# Patient Record
Sex: Female | Born: 1960 | Race: Black or African American | Hispanic: No | Marital: Single | Smoking: Never smoker
Health system: Southern US, Community
[De-identification: ages and names within clinical notes are randomized; demographics above are authoritative.]

## PROBLEM LIST (undated history)

## (undated) DIAGNOSIS — I5020 Unspecified systolic (congestive) heart failure: Secondary | ICD-10-CM

## (undated) DIAGNOSIS — I10 Essential (primary) hypertension: Secondary | ICD-10-CM

## (undated) DIAGNOSIS — I700 Atherosclerosis of aorta: Secondary | ICD-10-CM

## (undated) DIAGNOSIS — I480 Paroxysmal atrial fibrillation: Secondary | ICD-10-CM

## (undated) DIAGNOSIS — I77810 Thoracic aortic ectasia: Secondary | ICD-10-CM

## (undated) DIAGNOSIS — I519 Heart disease, unspecified: Secondary | ICD-10-CM

## (undated) HISTORY — DX: Essential (primary) hypertension: I10

## (undated) HISTORY — PX: ANKLE SURGERY: SHX546

## (undated) HISTORY — DX: Thoracic aortic ectasia: I77.810

## (undated) HISTORY — DX: Atherosclerosis of aorta: I70.0

---

## 2014-05-07 ENCOUNTER — Observation Stay: Payer: Self-pay | Admitting: Internal Medicine

## 2014-05-07 DIAGNOSIS — I517 Cardiomegaly: Secondary | ICD-10-CM

## 2014-05-07 LAB — CBC WITH DIFFERENTIAL/PLATELET
BASOS ABS: 0.1 10*3/uL (ref 0.0–0.1)
BASOS PCT: 0.7 %
EOS PCT: 0.4 %
Eosinophil #: 0 10*3/uL (ref 0.0–0.7)
HCT: 42.6 % (ref 35.0–47.0)
HGB: 13.8 g/dL (ref 12.0–16.0)
Lymphocyte #: 2.4 10*3/uL (ref 1.0–3.6)
Lymphocyte %: 30.9 %
MCH: 29.5 pg (ref 26.0–34.0)
MCHC: 32.5 g/dL (ref 32.0–36.0)
MCV: 91 fL (ref 80–100)
MONO ABS: 0.6 x10 3/mm (ref 0.2–0.9)
Monocyte %: 7.4 %
NEUTROS PCT: 60.6 %
Neutrophil #: 4.6 10*3/uL (ref 1.4–6.5)
PLATELETS: 269 10*3/uL (ref 150–440)
RBC: 4.69 10*6/uL (ref 3.80–5.20)
RDW: 13.8 % (ref 11.5–14.5)
WBC: 7.6 10*3/uL (ref 3.6–11.0)

## 2014-05-07 LAB — URINALYSIS, COMPLETE
BACTERIA: NONE SEEN
BLOOD: NEGATIVE
Bilirubin,UR: NEGATIVE
Glucose,UR: NEGATIVE mg/dL (ref 0–75)
Hyaline Cast: 20
KETONE: NEGATIVE
LEUKOCYTE ESTERASE: NEGATIVE
NITRITE: NEGATIVE
Ph: 5 (ref 4.5–8.0)
Protein: NEGATIVE
RBC,UR: 4 /HPF (ref 0–5)
SPECIFIC GRAVITY: 1.023 (ref 1.003–1.030)
Squamous Epithelial: 4

## 2014-05-07 LAB — BASIC METABOLIC PANEL
Anion Gap: 8 (ref 7–16)
BUN: 18 mg/dL (ref 7–18)
CO2: 33 mmol/L — AB (ref 21–32)
Calcium, Total: 8.8 mg/dL (ref 8.5–10.1)
Chloride: 99 mmol/L (ref 98–107)
Creatinine: 0.92 mg/dL (ref 0.60–1.30)
GLUCOSE: 95 mg/dL (ref 65–99)
Osmolality: 281 (ref 275–301)
POTASSIUM: 2.7 mmol/L — AB (ref 3.5–5.1)
Sodium: 140 mmol/L (ref 136–145)

## 2014-05-07 LAB — MAGNESIUM: Magnesium: 2 mg/dL

## 2014-05-07 LAB — CK TOTAL AND CKMB (NOT AT ARMC)
CK, TOTAL: 94 U/L
CK, Total: 109 U/L
CK-MB: 1.1 ng/mL (ref 0.5–3.6)
CK-MB: 0.9 ng/mL (ref 0.5–3.6)

## 2014-05-07 LAB — TROPONIN I: Troponin-I: 0.02 ng/mL

## 2014-05-07 LAB — POTASSIUM: POTASSIUM: 3.4 mmol/L — AB (ref 3.5–5.1)

## 2014-05-10 ENCOUNTER — Encounter: Payer: Self-pay | Admitting: *Deleted

## 2014-05-11 ENCOUNTER — Encounter: Payer: Self-pay | Admitting: Cardiovascular Disease

## 2014-11-03 NOTE — Discharge Summary (Signed)
PATIENT NAME:  [name], [name] MR#:  [id number] DATE OF BIRTH:  [birth year]  DATE OF ADMISSION:  05/07/2014 DATE OF DISCHARGE:  05/07/2014  PRESENTING COMPLAINT: An episode of dizziness and near syncope.   DISCHARGE DIAGNOSES:  1. Near syncope resolved, suspect could be vasovagal.  2. Hypokalemia repleted.  3. Hypertension.  4 EKG with T wave inversion asymptomatic. Follow up as outpatient with cardiology.   CONDITION ON DISCHARGE: Fair.   CODE STATUS: Full code.   MEDICATIONS:  1. Losartan 50 mg p.o. daily.  2. Aspirin 81 mg daily. The patient recommend to start taking after dental procedure.  3. Ibuprofen 600 mg 1 tablet 3 times a day as needed.  4. Follow up with Dr. Kirke Corin for abnormal EKG. 5. Follow up with your PCP in [place].   Cardiac enzymes x 3 negative. Serum potassium was 3.4. Ultrasound carotid Doppler negative for stenosis. An LV ejection fraction 65 to 70% impaired relaxation of the diastolic feeling moderate continent left ventricular hypertrophy. CBC within normal limits. Basic metabolic panel within normal limits. Urinalysis negative for urinary tract infection.   BRIEF SUMMARY OF HOSPITAL COURSE:  1. Jo Knapp is a 54 year old very pleasant African American female with history of hypertension who came into the Emergency Room after she fell having near syncopal episode x 2 along with dizziness at work. She was admitted with near syncopal episode, which likely was vasovagal. No acute cerebrovascular accident on CT head. Remained in sinus rhythm 66 on telemetry. Cardiac enzymes are negative. No chest pain or shortness of breath. No neuro deficits noted. Ultrasound carotid Doppler was negative.  2. Hypokalemia repleted.  3. Hypertension. Resumed. Losartan. 4. EKG with T wave inversion in lateral leads status: I spoke with Dr. Kirke Corin who will see the patient as outpatient. Echo showed results as above. Hospital stay otherwise remained stable.   CODE STATUS: The  patient remained a full code x 10.   TIME SPENT: 40 minutes.    ____________________________ Wylie Hail Allena Katz, MD sap:JT D: 05/08/2014 19:37:03 ET T: 05/09/2014 06:05:12 ET JOB#: [id number]  cc: Jo Knapp A. Allena Katz, MD, <Dictator> Jo Ora MD ELECTRONICALLY SIGNED 05/24/2014 7:41

## 2014-11-03 NOTE — H&P (Signed)
PATIENT NAME:  Jo Knapp, GREENLAW MR#:  [id number] DATE OF BIRTH:  [birth year]  DATE OF ADMISSION:  05/07/2014  REFERRING PHYSICIAN:  Rebecka Apley, MD   PRIMARY CARE PHYSICIAN:  Primary care physician in [place]   ADMITTING PHYSICIAN:  Crissie Figures, MD  CHIEF COMPLAINT:  Episodes of dizziness with near syncope x 2 episodes.   HISTORY OF PRESENT ILLNESS:  Jo Knapp is a 54 year old African-American female with a past medical history significant for hypertension, who was in his usual state of health and while she was at work tonight, she suddenly felt episodes of dizziness associated with some lightheadedness. She stated that she felt hot and sweaty and following which she felt dizziness, but did not lose consciousness. This happened twice at 45-minute interval in-between. No associated chest pain. No shortness of breath. No focal weakness or numbness. No nausea. No vomiting now. No diarrhea. No abdominal pain. No history of any recent cough, cold, fever. She did have a toothache recently for which she was given some pain medications. The patient was brought to the Emergency Room and since the initial episodes of dizziness, she does not have any further episodes of dizziness or loss of consciousness. No chest pains or shortness of breath.   In the Emergency Room, the patient was evaluated by the ED physician and was found to have elevated blood pressure but otherwise normal examination and workup was essentially negative except for EKG with sinus bradycardia and T wave inversions in lateral leads. She was also noted to have low potassium of 2.7. The hospitalist service was consulted for further management. The patient is comfortably resting in bed at this time and denies any complaints such as dizziness, chest pain, or diaphoresis.   PAST MEDICAL HISTORY:  Hypertension.   PAST SURGICAL HISTORY:  1.  Bilateral tubal ligation.  2.  Ankle surgery.  HOME MEDICATIONS:  Metoprolol,  hydrochlorothiazide, losartan, dosage not known.   ALLERGIES:  No known drug allergies.   SOCIAL HISTORY:  She is single, works at [employer]. No history of smoking. She does take occasional alcohol and denies any drug usage.   FAMILY HISTORY:  Significant for mother with heart disease.    REVIEW OF SYSTEMS:  CONSTITUTIONAL:  Negative for fever, fatigue, general weakness, syncope, abnormal weight gain or weight loss recently.  EYES:  Negative for blurred vision or double vision. No pain. No redness. No inflammation.  EARS, NOSE, AND THROAT:  Negative for tinnitus, ear pain, hearing loss. No epistaxis. No nasal discharge. No difficulty swallowing.  RESPIRATORY:  Negative for cough, wheezing, hemoptysis, dyspnea, painful respirations.  CARDIOVASCULAR:  Negative for chest pain, dyspnea on exertion, orthopnea, pedal edema, palpitations. She did have some near syncopal episode with lightheadedness as mentioned in the history of present illness but denies any history of loss of consciousness.  GASTROINTESTINAL:  Negative for nausea, vomiting, diarrhea, abdominal pain, hematemesis, melena, GERD symptoms, or rectal bleeding.  GENITOURINARY:  Negative for dysuria, hematuria, frequency, or urgency.   ENDOCRINE:  Negative for polyuria or polydipsia. No heat or cold intolerance.  HEMATOLOGIC AND LYMPHATIC:  Negative for anemia, easy bruising, bleeding, or swollen glands.  INTEGUMENTARY:  Negative for acne, skin rash, or lesions.  MUSCULOSKELETAL:  Negative for neck pain, back pain, arthritis, or joint swellings.  NEUROLOGICAL:  Negative for focal weakness or numbness. No history of CVA, TIA, or seizure episodes. She did have some near syncopal episodes as noted in the history of present illness.  PSYCHIATRIC:  Negative  for anxiety, insomnia, or depression.    PHYSICAL EXAMINATION: VITAL SIGNS:  On arrival, temperature 97.9 degrees Fahrenheit, pulse rate 57 per minute, respirations 18 per  minute, blood pressure 168/100, oxygen saturation 95% on room air. Current vital signs:  Pulse rate 66 per minute, respirations 18 per minute, blood pressure 137/86, oxygen saturation 95% on room air.  GENERAL:  Well-developed, well-nourished, young female, pleasant and cooperative, comfortably lying in the bed, not in any distress. Alert and oriented.  HEAD:  Atraumatic, normocephalic.   EYES:  Pupils are equal and reactive to light and accommodation. No conjunctival pallor. No scleral icterus. Extraocular movements are intact.  NOSE:  No nasal lesions. No drainage.  EARS:  No drainage. No external lesions.  Oral cavity:  No mucosal lesions. No exudates. No masses.  NECK:  Supple. No JVD. No thyromegaly. No carotid bruit. Range of motion is normal.  RESPIRATORY:  Good respiratory effort. Not using any accessory muscles of respiration. Bilateral vesicular breath sounds present. No rales or rhonchi noted.  CARDIOVASCULAR:  S1, S2 regular. No murmurs appreciated. No gallops. No clicks. Pulses are equal at carotid, femur, and pedal pulses. No peripheral edema.  GASTROINTESTINAL:  Abdomen is soft and nontender. No organomegaly. The bowel sounds are present and equal in all four quadrants. No rebound. No guarding. No rigidity.  GENITOURINARY:  Deferred.  MUSCULOSKELETAL:  No joint tenderness or effusions. Range of motion is adequate. Strength and tone are equal bilaterally.  SKIN:  Inspection within normal limits.  LYMPH NODES:  No cervical lymphadenopathy.  VASCULAR:  Good dorsalis pedis and posterior tibial pulses.  NEUROLOGICAL:  Alert, awake, and oriented x 3. Cranial nerves II through XII are grossly intact. DTRs are 2+ bilaterally and symmetrical in upper and lower extremities. Motor strength is 5/5 in both upper and lower extremities bilaterally.  PSYCHIATRIC:  Judgment and insight are adequate. Alert and oriented x 3. Motor and memory are within normal limits.    LABORATORY DATA:  Serum glucose  is 95, BUN 18, creatinine 0.92, serum sodium 140, potassium 2.7, chloride 99, bicarbonate 33, and calcium 8.8. Troponin is less than 0.02. WBC is 7.6, hemoglobin 13.8, hematocrit 42.6, platelet count 269, MCV 91. Urinalysis:  No bacteria.   IMAGING STUDIES:  CT of the head, noncontrast study:  White matter changes in the  periventricular region with hypodensities,  white matter hypoattenuation, age indeterminate lacunar infarction. Recommend clinical correlation for acute ischemia.   EKG:  Sinus bradycardia with ventricular rate of 54 beats per minute and T-wave inversions in anterior lateral leads.   ASSESSMENT AND PLAN:  A 54 year old African-American female with a past medical history significant for hypertension, who presents with episodes of near syncope x 2 episodes 45 minutes apart each.   1.  Near syncopal episodes x 2, likely vasovagal, rule out arrhythmias, rule out acute coronary event, rule out acute neurological event. Plan:  Admit to telemetry. Aspirin, orthostatic vital signs, cycle cardiac enzymes, carotid Doppler, echocardiogram, and monitor.   2.  Hypokalemia. Will start potassium supplementation IV and oral supplementation, follow BMP. Check magnesium level and follow up accordingly.  3.  Hypertension, history of hypertension, blood pressure high on presentation, current blood pressure in acceptable range. Continue home medications.  4.  Deep vein thrombosis prophylaxis with subcutaneous Lovenox. 5.  Gastrointestinal prophylaxis with Protonix.   CODE STATUS:  Full code.   TIME SPENT:  55 minutes.     ____________________________ Crissie Figures, MD enr:nb D: 05/07/2014 88:11:03 ET T:  05/07/2014 06:42:47 ET JOB#: [id number]  cc: Crissie Figures, MD, <Dictator> Crissie Figures MD ELECTRONICALLY SIGNED 05/10/2014 19:46

## 2018-08-30 ENCOUNTER — Encounter: Payer: Self-pay | Admitting: *Deleted

## 2018-08-30 ENCOUNTER — Other Ambulatory Visit: Payer: Self-pay

## 2018-08-30 ENCOUNTER — Emergency Department
Admission: EM | Admit: 2018-08-30 | Discharge: 2018-08-30 | Disposition: A | Discharge status: Home / Self Care | Payer: Self-pay | Attending: Emergency Medicine | Admitting: Emergency Medicine

## 2018-08-30 DIAGNOSIS — N3000 Acute cystitis without hematuria: Secondary | ICD-10-CM | POA: Insufficient documentation

## 2018-08-30 DIAGNOSIS — I10 Essential (primary) hypertension: Secondary | ICD-10-CM | POA: Insufficient documentation

## 2018-08-30 DIAGNOSIS — Z7982 Long term (current) use of aspirin: Secondary | ICD-10-CM | POA: Insufficient documentation

## 2018-08-30 LAB — URINALYSIS, COMPLETE (UACMP) WITH MICROSCOPIC
Bacteria, UA: NONE SEEN
Bilirubin Urine: NEGATIVE
Glucose, UA: NEGATIVE mg/dL
Hgb urine dipstick: NEGATIVE
Ketones, ur: 5 mg/dL — AB
Nitrite: NEGATIVE
Protein, ur: 30 mg/dL — AB
SPECIFIC GRAVITY, URINE: 1.036 — AB (ref 1.005–1.030)
WBC, UA: >50 WBC/hpf — ABNORMAL HIGH (ref 0–5)
pH: 5 (ref 5.0–8.0)

## 2018-08-30 LAB — CBC
HCT: 43.1 % (ref 36.0–46.0)
Hemoglobin: 14.1 g/dL (ref 12.0–15.0)
MCH: 29.6 pg (ref 26.0–34.0)
MCHC: 32.7 g/dL (ref 30.0–36.0)
MCV: 90.4 fL (ref 80.0–100.0)
PLATELETS: 280 10*3/uL (ref 150–400)
RBC: 4.77 MIL/uL (ref 3.87–5.11)
RDW: 13.4 % (ref 11.5–15.5)
WBC: 9.2 10*3/uL (ref 4.0–10.5)
nRBC: 0 % (ref 0.0–0.2)

## 2018-08-30 LAB — COMPREHENSIVE METABOLIC PANEL
ALT: 32 U/L (ref 0–44)
AST: 34 U/L (ref 15–41)
Albumin: 4.3 g/dL (ref 3.5–5.0)
Alkaline Phosphatase: 76 U/L (ref 38–126)
Anion gap: 5 (ref 5–15)
BUN: 21 mg/dL — ABNORMAL HIGH (ref 6–20)
CO2: 30 mmol/L (ref 22–32)
Calcium: 8.7 mg/dL — ABNORMAL LOW (ref 8.9–10.3)
Chloride: 106 mmol/L (ref 98–111)
Creatinine, Ser: 0.76 mg/dL (ref 0.44–1.00)
GFR calc non Af Amer: >60 mL/min (ref >60)
Glucose, Bld: 105 mg/dL — ABNORMAL HIGH (ref 70–99)
Potassium: 3.1 mmol/L — ABNORMAL LOW (ref 3.5–5.1)
SODIUM: 141 mmol/L (ref 135–145)
Total Bilirubin: 0.7 mg/dL (ref 0.3–1.2)
Total Protein: 7.6 g/dL (ref 6.5–8.1)

## 2018-08-30 LAB — LIPASE, BLOOD: Lipase: 38 U/L (ref 11–51)

## 2018-08-30 MED ORDER — ONDANSETRON 4 MG PO TBDP
8.0000 mg | ORAL_TABLET | Freq: Once | ORAL | Status: AC
  Administered 2018-08-30: 8 mg via ORAL
  Filled 2018-08-30: qty 2

## 2018-08-30 MED ORDER — SODIUM CHLORIDE 0.9% FLUSH: 3.0000 mL | Freq: Once | INTRAVENOUS | Status: DC

## 2018-08-30 MED ORDER — ONDANSETRON 8 MG PO TBDP: 8.0000 mg | ORAL_TABLET | Freq: Three times a day (TID) | ORAL | 0 refills | Status: DC | PRN

## 2018-08-30 MED ORDER — CEPHALEXIN 500 MG PO CAPS: 500.0000 mg | ORAL_CAPSULE | Freq: Two times a day (BID) | ORAL | 0 refills | Status: AC

## 2018-08-30 MED ORDER — CEPHALEXIN 500 MG PO CAPS
500.0000 mg | ORAL_CAPSULE | Freq: Once | ORAL | Status: AC
  Administered 2018-08-30: 500 mg via ORAL
  Filled 2018-08-30: qty 1

## 2018-08-30 NOTE — ED Notes (Signed)
EDP in with patient 

## 2018-08-30 NOTE — ED Notes (Signed)
Patient states stomach has been aching like menstrual cramps but has not had a period in a long time for a few days and having body aches starting tonight. Patient states became nauseated at work and came to ED. Patient states having some incontinence of urine the past 2 weeks.

## 2018-08-30 NOTE — ED Provider Notes (Signed)
[name] Emergency Department Provider Note ____________________________________________   First MD Initiated Contact with Patient 08/30/18 0404     (approximate)  I have reviewed the triage vital signs and the nursing notes.   HISTORY  Chief Complaint Nausea    HPI Jo Knapp is a 58 y.o. female with PMH of hypertension who presents with nausea and lower abdominal crampy pain over the last several days.  She also reports some urinary frequency (but not incontinence as noted by RN) over the last 1 to 2 weeks.  The patient denies associated vomiting, fever, or weakness.  She states that she has had UTIs when she was younger but none in the last several years.   Past Medical History:  Diagnosis Date  . Hypertension     There are no active problems to display for this patient.     Prior to Admission medications   Medication Sig Start Date End Date Taking? Authorizing Provider  aspirin 81 MG tablet Take 81 mg by mouth daily.    [provider]  cephALEXin (KEFLEX) 500 MG capsule Take 1 capsule (500 mg total) by mouth 2 (two) times daily for 7 days. 08/30/18 09/06/18  Dionne Bucy, MD  ibuprofen (ADVIL,MOTRIN) 600 MG tablet Take 600 mg by mouth 3 (three) times daily as needed.    [provider]  losartan (COZAAR) 50 MG tablet Take 50 mg by mouth daily.    [provider]  ondansetron (ZOFRAN ODT) 8 MG disintegrating tablet Take 1 tablet (8 mg total) by mouth every 8 (eight) hours as needed for nausea or vomiting. 08/30/18   Dionne Bucy, MD    Allergies Patient has no known allergies.  Family History  Problem Relation Age of Onset  . Heart disease Mother     Social History Social History   Tobacco Use  . Smoking status: Never Smoker  . Smokeless tobacco: Never Used  Substance Use Topics  . Alcohol use: Yes  . Drug use: Not Currently    Review of Systems  Constitutional: No fever. Eyes: No  redness. ENT: No sore throat. Cardiovascular: Denies chest pain. Respiratory: Denies shortness of breath. Gastrointestinal: Positive for nausea, no vomiting or diarrhea. Genitourinary: Positive for frequency. Musculoskeletal: Negative for back pain. Skin: Negative for rash. Neurological: Negative for headache.   ____________________________________________   PHYSICAL EXAM:  VITAL SIGNS: ED Triage Vitals [08/30/18 0033]  Enc Vitals Group     BP (!) 173/108     Pulse Rate 86     Resp 20     Temp 98 F (36.7 C)     Temp Source Oral     SpO2 97 %     Weight 210 lb (95.3 kg)     Height 5\' 6"  (1.676 m)     Head Circumference      Peak Flow      Pain Score 8     Pain Loc      Pain Edu?      Excl. in GC?     Constitutional: Alert and oriented. Well appearing and in no acute distress. Eyes: Conjunctivae are normal.  Head: Atraumatic. Nose: No congestion/rhinnorhea. Mouth/Throat: Mucous membranes are moist.   Neck: Normal range of motion.  Cardiovascular: Good peripheral circulation. Respiratory: Normal respiratory effort.  No retractions. Gastrointestinal: Soft and nontender. No distention.  Genitourinary: No flank tenderness. Musculoskeletal:  Extremities warm and well perfused.  Neurologic:  Normal speech and language. No gross focal neurologic deficits are appreciated.  Skin:  Skin is warm and dry. No rash noted. Psychiatric: Mood and affect are normal. Speech and behavior are normal.  ____________________________________________   LABS (all labs ordered are listed, but only abnormal results are displayed)  Labs Reviewed  COMPREHENSIVE METABOLIC PANEL - Abnormal; Notable for the following components:      Result Value   Potassium 3.1 (*)    Glucose, Bld 105 (*)    BUN 21 (*)    Calcium 8.7 (*)    All other components within normal limits  URINALYSIS, COMPLETE (UACMP) WITH MICROSCOPIC - Abnormal; Notable for the following components:   Color, Urine AMBER (*)     APPearance HAZY (*)    Specific Gravity, Urine 1.036 (*)    Ketones, ur 5 (*)    Protein, ur 30 (*)    Leukocytes,Ua MODERATE (*)    WBC, UA >50 (*)    All other components within normal limits  LIPASE, BLOOD  CBC   ____________________________________________  EKG   ____________________________________________  RADIOLOGY    ____________________________________________   PROCEDURES  Procedure(s) performed: No  Procedures  Critical Care performed: No ____________________________________________   INITIAL IMPRESSION / ASSESSMENT AND PLAN / ED COURSE  Pertinent labs & imaging results that were available during my care of the patient were reviewed by me and considered in my medical decision making (see chart for details).  58 year old female with PMH of hypertension presents with nausea acute onset today but not associated with vomiting or diarrhea.  In addition she has had crampy lower abdominal pain over the last few days, and reports urinary frequency over the last 1 to 2 weeks.  I reviewed the past medical records in Epic.  The patient was most recently admitted in 2015 for near syncope and hypokalemia.  On exam the patient is overall very well-appearing.  She has hypertension (and states that she has not taken her blood pressure medication today) but otherwise normal vital signs.  Her abdomen is soft and nontender.  The remainder of the exam is unremarkable.  Lab work-up obtained from triage is reassuring.  However the patient does have findings consistent with UTI on her urinalysis.  UTI/cystitis is overall the most likely explanation of her symptoms.  Differential also includes gastroenteritis.  The patient is postmenopausal, and I do not suspect any acute gynecologic cause.  Although the blood pressure is elevated, this is likely related to poor medication compliance.  The patient has no symptoms of endorgan dysfunction and no evidence of hypertensive  urgency.  At this time the patient is stable for discharge home.  There is no indication for imaging.  We will treat for UTI with Keflex, and give Zofran for nausea.  Return precautions given, the patient expresses understanding. ____________________________________________   FINAL CLINICAL IMPRESSION(S) / ED DIAGNOSES  Final diagnoses:  Acute cystitis without hematuria      NEW MEDICATIONS STARTED DURING THIS VISIT:  Discharge Medication List as of 08/30/2018  4:56 AM    START taking these medications   Details  cephALEXin (KEFLEX) 500 MG capsule Take 1 capsule (500 mg total) by mouth 2 (two) times daily for 7 days., Starting Tue 08/30/2018, Until Tue 09/06/2018, Normal    ondansetron (ZOFRAN ODT) 8 MG disintegrating tablet Take 1 tablet (8 mg total) by mouth every 8 (eight) hours as needed for nausea or vomiting., Starting Tue 08/30/2018, Normal         Note:  This document was prepared using Dragon voice recognition software and may  include unintentional dictation errors.    Dionne Bucy, MD 08/30/18 [phone]

## 2018-08-30 NOTE — ED Triage Notes (Signed)
Pt c/o nausea.  Sx began tonight while at work.  Pt also reports abd pain.  Pt alert  Speech clear.

## 2018-08-30 NOTE — Discharge Instructions (Signed)
Take the antibiotic as prescribed and finish the full course.  You may take the Zofran as needed for nausea.  Return to the ER for new, worsening, or persistent abdominal pain, nausea or vomiting, fever, weakness, or any other new or worsening symptoms that concern you.

## 2019-12-13 ENCOUNTER — Emergency Department: Payer: No Typology Code available for payment source

## 2019-12-13 ENCOUNTER — Emergency Department
Admission: EM | Admit: 2019-12-13 | Discharge: 2019-12-13 | Disposition: A | Discharge status: Home / Self Care | Payer: No Typology Code available for payment source | Attending: Emergency Medicine | Admitting: Emergency Medicine

## 2019-12-13 ENCOUNTER — Other Ambulatory Visit: Payer: Self-pay

## 2019-12-13 ENCOUNTER — Encounter: Payer: Self-pay | Admitting: Emergency Medicine

## 2019-12-13 DIAGNOSIS — S7002XA Contusion of left hip, initial encounter: Secondary | ICD-10-CM | POA: Insufficient documentation

## 2019-12-13 DIAGNOSIS — Y9389 Activity, other specified: Secondary | ICD-10-CM | POA: Insufficient documentation

## 2019-12-13 DIAGNOSIS — M25512 Pain in left shoulder: Secondary | ICD-10-CM | POA: Insufficient documentation

## 2019-12-13 DIAGNOSIS — Y92410 Unspecified street and highway as the place of occurrence of the external cause: Secondary | ICD-10-CM | POA: Diagnosis not present

## 2019-12-13 DIAGNOSIS — I10 Essential (primary) hypertension: Secondary | ICD-10-CM | POA: Insufficient documentation

## 2019-12-13 DIAGNOSIS — S161XXA Strain of muscle, fascia and tendon at neck level, initial encounter: Secondary | ICD-10-CM | POA: Insufficient documentation

## 2019-12-13 DIAGNOSIS — Y999 Unspecified external cause status: Secondary | ICD-10-CM | POA: Diagnosis not present

## 2019-12-13 DIAGNOSIS — R030 Elevated blood-pressure reading, without diagnosis of hypertension: Secondary | ICD-10-CM

## 2019-12-13 DIAGNOSIS — S199XXA Unspecified injury of neck, initial encounter: Secondary | ICD-10-CM | POA: Diagnosis present

## 2019-12-13 MED ORDER — NAPROXEN 500 MG PO TABS: 500.0000 mg | ORAL_TABLET | Freq: Two times a day (BID) | ORAL | 0 refills | Status: DC
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
## 2020-04-08 ENCOUNTER — Encounter (HOSPITAL_COMMUNITY): Payer: Self-pay | Admitting: Emergency Medicine

## 2020-04-08 ENCOUNTER — Emergency Department (HOSPITAL_COMMUNITY): Payer: HRSA Program

## 2020-04-08 ENCOUNTER — Other Ambulatory Visit: Payer: Self-pay

## 2020-04-08 DIAGNOSIS — J1282 Pneumonia due to coronavirus disease 2019: Secondary | ICD-10-CM | POA: Diagnosis present

## 2020-04-08 DIAGNOSIS — J9601 Acute respiratory failure with hypoxia: Secondary | ICD-10-CM | POA: Diagnosis present

## 2020-04-08 DIAGNOSIS — I10 Essential (primary) hypertension: Secondary | ICD-10-CM | POA: Diagnosis present

## 2020-04-08 DIAGNOSIS — U071 COVID-19: Secondary | ICD-10-CM | POA: Diagnosis not present

## 2020-04-08 DIAGNOSIS — Z7982 Long term (current) use of aspirin: Secondary | ICD-10-CM

## 2020-04-08 DIAGNOSIS — Z79899 Other long term (current) drug therapy: Secondary | ICD-10-CM

## 2020-04-08 DIAGNOSIS — Z8249 Family history of ischemic heart disease and other diseases of the circulatory system: Secondary | ICD-10-CM

## 2020-04-08 DIAGNOSIS — Z791 Long term (current) use of non-steroidal anti-inflammatories (NSAID): Secondary | ICD-10-CM

## 2020-04-08 DIAGNOSIS — E876 Hypokalemia: Secondary | ICD-10-CM | POA: Diagnosis present

## 2020-04-08 DIAGNOSIS — J159 Unspecified bacterial pneumonia: Secondary | ICD-10-CM | POA: Diagnosis present

## 2020-04-08 DIAGNOSIS — R0682 Tachypnea, not elsewhere classified: Secondary | ICD-10-CM | POA: Diagnosis not present

## 2020-04-08 DIAGNOSIS — E669 Obesity, unspecified: Secondary | ICD-10-CM | POA: Diagnosis present

## 2020-04-08 DIAGNOSIS — Z6833 Body mass index (BMI) 33.0-33.9, adult: Secondary | ICD-10-CM

## 2020-04-08 DIAGNOSIS — I2699 Other pulmonary embolism without acute cor pulmonale: Secondary | ICD-10-CM | POA: Diagnosis present

## 2020-04-08 NOTE — ED Triage Notes (Signed)
Pt from home via [facility]. Pt reports SHOB that started at 1200 today. Pt reports positive COVID test on 9/16.

## 2020-04-09 ENCOUNTER — Emergency Department (HOSPITAL_COMMUNITY): Payer: HRSA Program

## 2020-04-09 ENCOUNTER — Other Ambulatory Visit: Payer: Self-pay

## 2020-04-09 ENCOUNTER — Inpatient Hospital Stay (HOSPITAL_COMMUNITY)
Admission: EM | Admit: 2020-04-09 | Discharge: 2020-04-12 | DRG: 177 | Disposition: A | Discharge status: Home / Self Care | Payer: HRSA Program | Attending: Family Medicine | Admitting: Family Medicine

## 2020-04-09 DIAGNOSIS — E669 Obesity, unspecified: Secondary | ICD-10-CM | POA: Diagnosis present

## 2020-04-09 DIAGNOSIS — I10 Essential (primary) hypertension: Secondary | ICD-10-CM | POA: Diagnosis present

## 2020-04-09 DIAGNOSIS — J069 Acute upper respiratory infection, unspecified: Secondary | ICD-10-CM | POA: Diagnosis present

## 2020-04-09 DIAGNOSIS — J1282 Pneumonia due to coronavirus disease 2019: Secondary | ICD-10-CM | POA: Diagnosis present

## 2020-04-09 DIAGNOSIS — Z7982 Long term (current) use of aspirin: Secondary | ICD-10-CM | POA: Diagnosis not present

## 2020-04-09 DIAGNOSIS — J9601 Acute respiratory failure with hypoxia: Secondary | ICD-10-CM | POA: Diagnosis present

## 2020-04-09 DIAGNOSIS — I2699 Other pulmonary embolism without acute cor pulmonale: Secondary | ICD-10-CM | POA: Diagnosis present

## 2020-04-09 DIAGNOSIS — U071 COVID-19: Secondary | ICD-10-CM | POA: Diagnosis present

## 2020-04-09 DIAGNOSIS — E876 Hypokalemia: Secondary | ICD-10-CM | POA: Diagnosis present

## 2020-04-09 DIAGNOSIS — J159 Unspecified bacterial pneumonia: Secondary | ICD-10-CM | POA: Diagnosis present

## 2020-04-09 DIAGNOSIS — R0682 Tachypnea, not elsewhere classified: Secondary | ICD-10-CM

## 2020-04-09 DIAGNOSIS — Z791 Long term (current) use of non-steroidal anti-inflammatories (NSAID): Secondary | ICD-10-CM | POA: Diagnosis not present

## 2020-04-09 DIAGNOSIS — R0902 Hypoxemia: Secondary | ICD-10-CM

## 2020-04-09 DIAGNOSIS — R079 Chest pain, unspecified: Secondary | ICD-10-CM

## 2020-04-09 DIAGNOSIS — Z79899 Other long term (current) drug therapy: Secondary | ICD-10-CM | POA: Diagnosis not present

## 2020-04-09 DIAGNOSIS — Z8249 Family history of ischemic heart disease and other diseases of the circulatory system: Secondary | ICD-10-CM | POA: Diagnosis not present

## 2020-04-09 DIAGNOSIS — Z6833 Body mass index (BMI) 33.0-33.9, adult: Secondary | ICD-10-CM | POA: Diagnosis not present

## 2020-04-09 LAB — D-DIMER, QUANTITATIVE: D-Dimer, Quant: 7.61 ug/mL-FEU — ABNORMAL HIGH (ref 0.00–0.50)

## 2020-04-09 LAB — COMPREHENSIVE METABOLIC PANEL
ALT: 34 U/L (ref 0–44)
AST: 32 U/L (ref 15–41)
Albumin: 2.9 g/dL — ABNORMAL LOW (ref 3.5–5.0)
Alkaline Phosphatase: 103 U/L (ref 38–126)
Anion gap: 11 (ref 5–15)
BUN: 12 mg/dL (ref 6–20)
CO2: 29 mmol/L (ref 22–32)
Calcium: 8.9 mg/dL (ref 8.9–10.3)
Chloride: 94 mmol/L — ABNORMAL LOW (ref 98–111)
Creatinine, Ser: 0.65 mg/dL (ref 0.44–1.00)
GFR calc Af Amer: >60 mL/min (ref >60)
GFR calc non Af Amer: >60 mL/min (ref >60)
Glucose, Bld: 127 mg/dL — ABNORMAL HIGH (ref 70–99)
Potassium: 3.1 mmol/L — ABNORMAL LOW (ref 3.5–5.1)
Sodium: 134 mmol/L — ABNORMAL LOW (ref 135–145)
Total Bilirubin: 1.6 mg/dL — ABNORMAL HIGH (ref 0.3–1.2)
Total Protein: 7.7 g/dL (ref 6.5–8.1)

## 2020-04-09 LAB — CBC WITH DIFFERENTIAL/PLATELET
Abs Immature Granulocytes: 0.12 10*3/uL — ABNORMAL HIGH (ref 0.00–0.07)
Basophils Absolute: 0.1 10*3/uL (ref 0.0–0.1)
Basophils Relative: 0 %
Eosinophils Absolute: 0 10*3/uL (ref 0.0–0.5)
Eosinophils Relative: 0 %
HCT: 38.9 % (ref 36.0–46.0)
Hemoglobin: 12.7 g/dL (ref 12.0–15.0)
Immature Granulocytes: 1 %
Lymphocytes Relative: 11 %
Lymphs Abs: 2 10*3/uL (ref 0.7–4.0)
MCH: 29.7 pg (ref 26.0–34.0)
MCHC: 32.6 g/dL (ref 30.0–36.0)
MCV: 90.9 fL (ref 80.0–100.0)
Monocytes Absolute: 1.7 10*3/uL — ABNORMAL HIGH (ref 0.1–1.0)
Monocytes Relative: 10 %
Neutro Abs: 14.2 10*3/uL — ABNORMAL HIGH (ref 1.7–7.7)
Neutrophils Relative %: 78 %
Platelets: 453 10*3/uL — ABNORMAL HIGH (ref 150–400)
RBC: 4.28 MIL/uL (ref 3.87–5.11)
RDW: 12.5 % (ref 11.5–15.5)
WBC: 18.2 10*3/uL — ABNORMAL HIGH (ref 4.0–10.5)
nRBC: 0 % (ref 0.0–0.2)

## 2020-04-09 LAB — FIBRINOGEN: Fibrinogen: >800 mg/dL — ABNORMAL HIGH (ref 210–475)

## 2020-04-09 LAB — RESPIRATORY PANEL BY RT PCR (FLU A&B, COVID)
Influenza A by PCR: NEGATIVE
Influenza A by PCR: NEGATIVE
Influenza B by PCR: NEGATIVE
Influenza B by PCR: NEGATIVE
SARS Coronavirus 2 by RT PCR: NEGATIVE
SARS Coronavirus 2 by RT PCR: POSITIVE — AB

## 2020-04-09 LAB — PROCALCITONIN: Procalcitonin: 0.68 ng/mL

## 2020-04-09 LAB — HIV ANTIBODY (ROUTINE TESTING W REFLEX): HIV Screen 4th Generation wRfx: NONREACTIVE

## 2020-04-09 LAB — APTT: aPTT: 32 seconds (ref 24–36)

## 2020-04-09 LAB — C-REACTIVE PROTEIN: CRP: 16.6 mg/dL — ABNORMAL HIGH (ref <1.0)

## 2020-04-09 LAB — FERRITIN: Ferritin: 576 ng/mL — ABNORMAL HIGH (ref 11–307)

## 2020-04-09 LAB — TRIGLYCERIDES: Triglycerides: 78 mg/dL (ref <150)

## 2020-04-09 LAB — LACTIC ACID, PLASMA
Lactic Acid, Venous: 1.4 mmol/L (ref 0.5–1.9)
Lactic Acid, Venous: 1.5 mmol/L (ref 0.5–1.9)

## 2020-04-09 LAB — PROTIME-INR
INR: 1.2 (ref 0.8–1.2)
Prothrombin Time: 14.4 seconds (ref 11.4–15.2)

## 2020-04-09 LAB — LACTATE DEHYDROGENASE: LDH: 277 U/L — ABNORMAL HIGH (ref 98–192)

## 2020-04-09 LAB — HEPARIN LEVEL (UNFRACTIONATED): Heparin Unfractionated: 0.25 IU/mL — ABNORMAL LOW (ref 0.30–0.70)

## 2020-04-09 LAB — HCG, SERUM, QUALITATIVE: Preg, Serum: NEGATIVE

## 2020-04-09 MED ORDER — SODIUM CHLORIDE 0.9 % IV SOLN
100.0000 mg | INTRAVENOUS | Status: AC
  Administered 2020-04-09 (×2): 100 mg via INTRAVENOUS
  Filled 2020-04-09: qty 20

## 2020-04-09 MED ORDER — HEPARIN BOLUS VIA INFUSION
1200.0000 [IU] | Freq: Once | INTRAVENOUS | Status: AC
  Administered 2020-04-09: 1200 [IU] via INTRAVENOUS

## 2020-04-09 MED ORDER — HYDROCHLOROTHIAZIDE 25 MG PO TABS
25.0000 mg | ORAL_TABLET | Freq: Every day | ORAL | Status: DC
  Administered 2020-04-09 – 2020-04-10 (×2): 25 mg via ORAL
  Filled 2020-04-09 (×2): qty 1

## 2020-04-09 MED ORDER — ZINC SULFATE 220 (50 ZN) MG PO CAPS
220.0000 mg | ORAL_CAPSULE | Freq: Every day | ORAL | Status: DC
  Administered 2020-04-09 – 2020-04-12 (×4): 220 mg via ORAL
  Filled 2020-04-09 (×4): qty 1

## 2020-04-09 MED ORDER — SODIUM CHLORIDE 0.9% FLUSH
3.0000 mL | INTRAVENOUS | Status: DC | PRN
  Administered 2020-04-09: 3 mL via INTRAVENOUS

## 2020-04-09 MED ORDER — SODIUM CHLORIDE 0.9 % IV SOLN
250.0000 mL | INTRAVENOUS | Status: DC | PRN
  Administered 2020-04-10: 250 mL via INTRAVENOUS

## 2020-04-09 MED ORDER — SODIUM CHLORIDE 0.9 % IV SOLN
1.0000 g | INTRAVENOUS | Status: DC
  Administered 2020-04-09 – 2020-04-12 (×4): 1 g via INTRAVENOUS
  Filled 2020-04-09 (×4): qty 10

## 2020-04-09 MED ORDER — LABETALOL HCL 5 MG/ML IV SOLN
10.0000 mg | INTRAVENOUS | Status: DC | PRN
  Administered 2020-04-09 – 2020-04-11 (×3): 10 mg via INTRAVENOUS
  Filled 2020-04-09 (×3): qty 4

## 2020-04-09 MED ORDER — SODIUM CHLORIDE 0.9 % IV SOLN
500.0000 mg | INTRAVENOUS | Status: DC
  Administered 2020-04-09 – 2020-04-12 (×4): 500 mg via INTRAVENOUS
  Filled 2020-04-09 (×4): qty 500

## 2020-04-09 MED ORDER — SODIUM CHLORIDE 0.9 % IV SOLN
100.0000 mg | Freq: Every day | INTRAVENOUS | Status: DC
  Administered 2020-04-10 – 2020-04-12 (×3): 100 mg via INTRAVENOUS
  Filled 2020-04-09 (×3): qty 20

## 2020-04-09 MED ORDER — ONDANSETRON HCL 4 MG/2ML IJ SOLN: 4.0000 mg | Freq: Four times a day (QID) | INTRAMUSCULAR | Status: DC | PRN

## 2020-04-09 MED ORDER — SODIUM CHLORIDE 0.9% FLUSH
3.0000 mL | Freq: Two times a day (BID) | INTRAVENOUS | Status: DC
  Administered 2020-04-09 – 2020-04-12 (×5): 3 mL via INTRAVENOUS

## 2020-04-09 MED ORDER — DEXAMETHASONE SODIUM PHOSPHATE 10 MG/ML IJ SOLN
6.0000 mg | INTRAMUSCULAR | Status: DC
  Administered 2020-04-09 – 2020-04-12 (×4): 6 mg via INTRAVENOUS
  Filled 2020-04-09 (×4): qty 1

## 2020-04-09 MED ORDER — ACETAMINOPHEN 325 MG PO TABS
650.0000 mg | ORAL_TABLET | Freq: Four times a day (QID) | ORAL | Status: DC | PRN
  Administered 2020-04-11 – 2020-04-12 (×4): 650 mg via ORAL
  Filled 2020-04-09 (×4): qty 2

## 2020-04-09 MED ORDER — ASPIRIN 81 MG PO CHEW
81.0000 mg | CHEWABLE_TABLET | Freq: Every day | ORAL | Status: DC
  Administered 2020-04-09 – 2020-04-12 (×4): 81 mg via ORAL
  Filled 2020-04-09 (×4): qty 1

## 2020-04-09 MED ORDER — ALBUTEROL SULFATE HFA 108 (90 BASE) MCG/ACT IN AERS: 2.0000 | INHALATION_SPRAY | Freq: Four times a day (QID) | RESPIRATORY_TRACT | Status: DC | PRN

## 2020-04-09 MED ORDER — KETOROLAC TROMETHAMINE 30 MG/ML IJ SOLN
15.0000 mg | Freq: Once | INTRAMUSCULAR | Status: AC
  Administered 2020-04-09: 15 mg via INTRAVENOUS
  Filled 2020-04-09: qty 1

## 2020-04-09 MED ORDER — HEPARIN BOLUS VIA INFUSION
4000.0000 [IU] | Freq: Once | INTRAVENOUS | Status: AC
  Administered 2020-04-09: 4000 [IU] via INTRAVENOUS

## 2020-04-09 MED ORDER — IOHEXOL 350 MG/ML SOLN
100.0000 mL | Freq: Once | INTRAVENOUS | Status: AC | PRN
  Administered 2020-04-09: 100 mL via INTRAVENOUS

## 2020-04-09 MED ORDER — HEPARIN (PORCINE) 25000 UT/250ML-% IV SOLN
1350.0000 [IU]/h | INTRAVENOUS | Status: DC
  Administered 2020-04-09: 1200 [IU]/h via INTRAVENOUS
  Administered 2020-04-10 – 2020-04-11 (×3): 1350 [IU]/h via INTRAVENOUS
  Filled 2020-04-09 (×4): qty 250

## 2020-04-09 MED ORDER — ASCORBIC ACID 500 MG PO TABS
500.0000 mg | ORAL_TABLET | Freq: Every day | ORAL | Status: DC
  Administered 2020-04-09 – 2020-04-12 (×4): 500 mg via ORAL
  Filled 2020-04-09 (×4): qty 1

## 2020-04-09 MED ORDER — HYDROCOD POLST-CPM POLST ER 10-8 MG/5ML PO SUER
5.0000 mL | Freq: Two times a day (BID) | ORAL | Status: DC | PRN
  Administered 2020-04-10 – 2020-04-11 (×2): 5 mL via ORAL
  Filled 2020-04-09 (×2): qty 5

## 2020-04-09 MED ORDER — POTASSIUM CHLORIDE CRYS ER 20 MEQ PO TBCR
40.0000 meq | EXTENDED_RELEASE_TABLET | Freq: Once | ORAL | Status: AC
  Administered 2020-04-09: 40 meq via ORAL
  Filled 2020-04-09: qty 2

## 2020-04-09 MED ORDER — SODIUM CHLORIDE 0.9 % IV SOLN
200.0000 mg | Freq: Once | INTRAVENOUS | Status: DC
  Administered 2020-04-09: 200 mg via INTRAVENOUS

## 2020-04-09 MED ORDER — GUAIFENESIN-DM 100-10 MG/5ML PO SYRP: 10.0000 mL | ORAL_SOLUTION | ORAL | Status: DC | PRN

## 2020-04-09 MED ORDER — LOSARTAN POTASSIUM 50 MG PO TABS
50.0000 mg | ORAL_TABLET | Freq: Every day | ORAL | Status: DC
  Administered 2020-04-09 – 2020-04-10 (×2): 50 mg via ORAL
  Filled 2020-04-09 (×2): qty 2

## 2020-04-09 MED ORDER — SODIUM CHLORIDE 0.9 % IV SOLN: 100.0000 mg | Freq: Every day | INTRAVENOUS | Status: DC

## 2020-04-09 MED ORDER — ONDANSETRON HCL 4 MG PO TABS: 4.0000 mg | ORAL_TABLET | Freq: Four times a day (QID) | ORAL | Status: DC | PRN

## 2020-04-09 NOTE — Progress Notes (Signed)
ANTICOAGULATION CONSULT NOTE -   Pharmacy Consult for Heparin  Indication: pulmonary embolus  No Known Allergies  Patient Measurements: Height: 5\' 6"  (167.6 cm) Weight: 95.3 kg (210 lb) IBW/kg (Calculated) : 59.3  Vital Signs: Temp: 98.5 F (36.9 C) (09/28 0809) Temp Source: Oral (09/28 0809) BP: 164/117 (09/28 1600) Pulse Rate: 107 (09/28 1600)  Labs: Recent Labs    04/09/20 0427 04/09/20 1501  HGB 12.7  --   HCT 38.9  --   PLT 453*  --   APTT 32  --   LABPROT 14.4  --   INR 1.2  --   HEPARINUNFRC  --  0.25*  CREATININE 0.65  --     Estimated Creatinine Clearance: 89.2 mL/min (by C-G formula based on SCr of 0.65 mg/dL).   Medical History: Past Medical History:  Diagnosis Date  . Hypertension     Assessment: 59 y/o F with new onset PE in setting of COVID-19. Starting heparin. CBC/renal function good. PTA meds reviewed.   HL 0.25- slightly subtherapeutic  Goal of Therapy:  Heparin level 0.3-0.7 units/ml Monitor platelets by anticoagulation protocol: Yes   Plan:  Heparin 1200 units Increase heparin drip to 1350 units/hr Heparin level in 6 hours and daily Daily CBC Monitor for bleeding  41, PharmD Clinical Pharmacist 04/09/2020 4:08 PM

## 2020-04-09 NOTE — ED Notes (Signed)
Date and time results received: 04/09/20 1055 (use smartphrase ".now" to insert current time)  Test: Covid Critical Value: Positive  Name of Provider Notified: Dr Sherryll Burger  Orders Received? Or Actions Taken?: NA

## 2020-04-09 NOTE — Progress Notes (Signed)
ANTICOAGULATION CONSULT NOTE - Initial Consult  Pharmacy Consult for Heparin  Indication: pulmonary embolus  No Known Allergies  Patient Measurements: Height: 5\' 6"  (167.6 cm) Weight: 95.3 kg (210 lb) IBW/kg (Calculated) : 59.3  Vital Signs: Temp: 100 F (37.8 C) (09/27 2347) Temp Source: Oral (09/27 2347) BP: 146/113 (09/28 0630) Pulse Rate: 118 (09/28 0630)  Labs: Recent Labs    04/09/20 0427  HGB 12.7  HCT 38.9  PLT 453*  CREATININE 0.65    Estimated Creatinine Clearance: 89.2 mL/min (by C-G formula based on SCr of 0.65 mg/dL).   Medical History: Past Medical History:  Diagnosis Date  . Hypertension     Assessment: 59 y/o F with new onset PE in setting of COVID-19. Starting heparin. CBC/renal function good. PTA meds reviewed.   Goal of Therapy:  Heparin level 0.3-0.7 units/ml Monitor platelets by anticoagulation protocol: Yes   Plan:  Heparin 4000 units BOLUS Start heparin drip at 1200 units/hr 1500 heparin level Daily CBC/HL Monitor for bleeding  41, PharmD, BCPS Clinical Pharmacist Phone: [phone]

## 2020-04-09 NOTE — ED Provider Notes (Signed)
[facility] EMERGENCY DEPARTMENT Provider Note   CSN: [id number] Arrival date & time: 04/08/20  2338   Time seen 03:55 AM  History Chief Complaint  Patient presents with  . Shortness of Breath    Jo Knapp is a 59 y.o. female.  HPI Patient states she had a positive Covid test on September 13 and shows me a picture of it from her primary care office.  She states she started having symptoms about 8 days before that, so around September 5.  She states she has had a dry cough and fever and chills.  She lost her sense of taste and smell around September 16.  She has had nausea and vomits about once a day.  She denies diarrhea, abdominal pain, rhinorrhea, sore throat.  She states she started having right anterior lower chest pain and shortness of breath 2 to 3 days ago.  She initially denied being around anyone who was sick but then states that at work there have been many sick people.  Patient has not had the Covid vaccine.  PCP [facility]     Past Medical History:  Diagnosis Date  . Hypertension     There are no problems to display for this patient.   Past Surgical History:  Procedure Laterality Date  . ANKLE SURGERY    . CESAREAN SECTION WITH BILATERAL TUBAL LIGATION       OB History   No obstetric history on file.     Family History  Problem Relation Age of Onset  . Heart disease Mother     Social History   Tobacco Use  . Smoking status: Never Smoker  . Smokeless tobacco: Never Used  Substance Use Topics  . Alcohol use: Yes  . Drug use: Not Currently  employed  Home Medications Prior to Admission medications   Medication Sig Start Date End Date Taking? Authorizing Provider  aspirin 81 MG tablet Take 81 mg by mouth daily.    [provider]  ibuprofen (ADVIL,MOTRIN) 600 MG tablet Take 600 mg by mouth 3 (three) times daily as needed.    [provider]  losartan (COZAAR) 50 MG tablet Take 50 mg by mouth daily.     [provider]  naproxen (NAPROSYN) 500 MG tablet Take 1 tablet (500 mg total) by mouth 2 (two) times daily with a meal. 12/13/19   Tommi Rumps, PA-C  Patient states she quit her losartan 1 to 2 months ago  Allergies    Patient has no known allergies.  Review of Systems   Review of Systems  All other systems reviewed and are negative.   Physical Exam Updated Vital Signs BP (!) 146/113   Pulse (!) 118   Temp 100 F (37.8 C) (Oral)   Resp (!) 21   Ht 5\' 6"  (1.676 m)   Wt 95.3 kg   SpO2 98%   BMI 33.89 kg/m   Physical Exam Vitals and nursing note reviewed.  Constitutional:      Appearance: Normal appearance. She is obese.  HENT:     Head: Normocephalic and atraumatic.     Right Ear: External ear normal.     Left Ear: External ear normal.  Eyes:     Extraocular Movements: Extraocular movements intact.     Conjunctiva/sclera: Conjunctivae normal.     Pupils: Pupils are equal, round, and reactive to light.  Cardiovascular:     Rate and Rhythm: Normal rate and regular rhythm.     Pulses:  Normal pulses.     Heart sounds: Normal heart sounds.  Pulmonary:     Effort: Tachypnea present. No prolonged expiration.     Breath sounds: Decreased air movement present.  Chest:       Comments: Area of chest pain noted Musculoskeletal:        General: No swelling or tenderness. Normal range of motion.     Cervical back: Normal range of motion.  Skin:    General: Skin is warm.     Capillary Refill: Capillary refill takes less than 2 seconds.  Neurological:     General: No focal deficit present.     Mental Status: She is alert and oriented to person, place, and time.     Cranial Nerves: No cranial nerve deficit.  Psychiatric:        Mood and Affect: Mood normal.        Behavior: Behavior normal.        Thought Content: Thought content normal.     ED Results / Procedures / Treatments   Labs (all labs ordered are listed, but only abnormal results are  displayed) Results for orders placed or performed during the hospital encounter of 04/09/20  Respiratory Panel by RT PCR (Flu A&B, Covid) - Nasopharyngeal Swab   Specimen: Nasopharyngeal Swab  Result Value Ref Range   SARS Coronavirus 2 by RT PCR NEGATIVE NEGATIVE   Influenza A by PCR NEGATIVE NEGATIVE   Influenza B by PCR NEGATIVE NEGATIVE  Lactic acid, plasma  Result Value Ref Range   Lactic Acid, Venous 1.4 0.5 - 1.9 mmol/L  CBC WITH DIFFERENTIAL  Result Value Ref Range   WBC 18.2 (H) 4.0 - 10.5 K/uL   RBC 4.28 3.87 - 5.11 MIL/uL   Hemoglobin 12.7 12.0 - 15.0 g/dL   HCT 38.4 36 - 46 %   MCV 90.9 80.0 - 100.0 fL   MCH 29.7 26.0 - 34.0 pg   MCHC 32.6 30.0 - 36.0 g/dL   RDW 66.5 99.3 - 57.0 %   Platelets 453 (H) 150 - 400 K/uL   nRBC 0.0 0.0 - 0.2 %   Neutrophils Relative % 78 %   Neutro Abs 14.2 (H) 1.7 - 7.7 K/uL   Lymphocytes Relative 11 %   Lymphs Abs 2.0 0.7 - 4.0 K/uL   Monocytes Relative 10 %   Monocytes Absolute 1.7 (H) 0 - 1 K/uL   Eosinophils Relative 0 %   Eosinophils Absolute 0.0 0 - 0 K/uL   Basophils Relative 0 %   Basophils Absolute 0.1 0 - 0 K/uL   Immature Granulocytes 1 %   Abs Immature Granulocytes 0.12 (H) 0.00 - 0.07 K/uL  Comprehensive metabolic panel  Result Value Ref Range   Sodium 134 (L) 135 - 145 mmol/L   Potassium 3.1 (L) 3.5 - 5.1 mmol/L   Chloride 94 (L) 98 - 111 mmol/L   CO2 29 22 - 32 mmol/L   Glucose, Bld 127 (H) 70 - 99 mg/dL   BUN 12 6 - 20 mg/dL   Creatinine, Ser 1.77 0.44 - 1.00 mg/dL   Calcium 8.9 8.9 - 93.9 mg/dL   Total Protein 7.7 6.5 - 8.1 g/dL   Albumin 2.9 (L) 3.5 - 5.0 g/dL   AST 32 15 - 41 U/L   ALT 34 0 - 44 U/L   Alkaline Phosphatase 103 38 - 126 U/L   Total Bilirubin 1.6 (H) 0.3 - 1.2 mg/dL   GFR calc non Af Amer >60 >60  mL/min   GFR calc Af Amer >60 >60 mL/min   Anion gap 11 5 - 15  D-dimer, quantitative  Result Value Ref Range   D-Dimer, Quant 7.61 (H) 0.00 - 0.50 ug/mL-FEU  Procalcitonin  Result Value Ref  Range   Procalcitonin 0.68 ng/mL  Lactate dehydrogenase  Result Value Ref Range   LDH 277 (H) 98 - 192 U/L  Triglycerides  Result Value Ref Range   Triglycerides 78 <150 mg/dL  Fibrinogen  Result Value Ref Range   Fibrinogen >800 (H) 210 - 475 mg/dL  hCG, serum, qualitative  Result Value Ref Range   Preg, Serum NEGATIVE NEGATIVE   Laboratory interpretation all normal except leukocytosis, very elevated D-dimer, hypokalemia, nonfasting hyperglycemia    EKG EKG Interpretation  Date/Time:  Tuesday April 09 2020 04:32:48 EDT Ventricular Rate:  118 PR Interval:    QRS Duration: 96 QT Interval:  314 QTC Calculation: 440 R Axis:   5 Text Interpretation: Sinus tachycardia Probable left atrial enlargement LVH with secondary repolarization abnormality Anterior Q waves, possibly due to LVH Since last tracing rate faster Confirmed by Devoria Albe ([id number]) on 04/09/2020 4:46:05 AM   Radiology CT Angio Chest PE W/Cm &/Or Wo Cm  Result Date: 04/09/2020 CLINICAL DATA:  COVID positive patient. Shortness of breath starting today. Suspected pulmonary embolus with high probability. EXAM: CT ANGIOGRAPHY CHEST WITH CONTRAST TECHNIQUE: Multidetector CT imaging of the chest was performed using the standard protocol during bolus administration of intravenous contrast. Multiplanar CT image reconstructions and MIPs were obtained to evaluate the vascular anatomy. CONTRAST:  OMNIPAQUE IOHEXOL 350 MG/ML SOLN COMPARISON:  None. FINDINGS: Cardiovascular: Good opacification of the central and segmental pulmonary arteries. Large filling defects in the distal right lobar pulmonary artery, extending into the right upper and lower lobe segmental pulmonary arteries. Changes are consistent with acute pulmonary embolus. The RV to LV ratio is 0.5, providing no evidence of right heart strain. No pericardial effusions. Dilated ascending aorta at 4.1 cm AP diameter. No aortic dissection. Great vessel origins are patent.  Mediastinum/Nodes: Esophagus is decompressed. No significant lymphadenopathy. Lungs/Pleura: Patchy peripheral and perihilar infiltrates bilaterally consistent with COVID pneumonia or residual fibrosis. No pleural effusions. No pneumothorax. Airways are patent. Upper Abdomen: No acute process demonstrated in the visualized upper abdomen. Musculoskeletal: No chest wall abnormality. No acute or significant osseous findings. Review of the MIP images confirms the above findings. IMPRESSION: 1. Positive for acute pulmonary embolus in the distal right lobar pulmonary artery, extending into the right upper and lower lobe segmental pulmonary arteries. No evidence of right heart strain. 2. Patchy peripheral and perihilar infiltrates bilaterally consistent with COVID pneumonia or fibrosis. 3. Dilated ascending aorta at 4.1 cm AP diameter. Critical Value/emergent results were called by telephone at the time of interpretation on 04/09/2020 at 6:03 am to provider Naythen Heikkila , who verbally acknowledged these results. Electronically Signed   By: Burman Nieves M.D.   On: 04/09/2020 06:06   DG Chest Portable 1 View  Result Date: 04/09/2020 CLINICAL DATA:  Generalized pain, shortness of breath, fever, and weakness. COVID positive for 12 days EXAM: PORTABLE CHEST 1 VIEW COMPARISON:  01/31/2014 FINDINGS: Normal heart size and pulmonary vascularity. Patchy infiltrates throughout the lungs with peribronchial thickening compatible with COVID pneumonia. No pleural effusions. No pneumothorax. Mediastinal contours appear intact. IMPRESSION: Patchy infiltrates throughout the lungs compatible with COVID pneumonia. Electronically Signed   By: Burman Nieves M.D.   On: 04/09/2020 00:28    Procedures .Critical Care Performed by:  Devoria Albe, MD Authorized by: Devoria Albe, MD   Critical care provider statement:    Critical care time (minutes):  39   Critical care was necessary to treat or prevent imminent or life-threatening  deterioration of the following conditions:  Respiratory failure   Critical care was time spent personally by me on the following activities:  Discussions with consultants, examination of patient, obtaining history from patient or surrogate, ordering and review of laboratory studies, ordering and review of radiographic studies, pulse oximetry, re-evaluation of patient's condition and review of old charts   (including critical care time)      Medications Ordered in ED Medications  ketorolac (TORADOL) 30 MG/ML injection 15 mg (15 mg Intravenous Given 04/09/20 0604)  iohexol (OMNIPAQUE) 350 MG/ML injection 100 mL (100 mLs Intravenous Contrast Given 04/09/20 0544)    ED Course  I have reviewed the triage vital signs and the nursing notes.  Pertinent labs & imaging results that were available during my care of the patient were reviewed by me and considered in my medical decision making (see chart for details).    MDM Rules/Calculators/A&P                          During my exam patient's pulse ox was 90 to 91% with heart rate 119.  My concern is that she has been having symptoms since September 8th and she presents now with right-sided chest pain, tachypnea, borderline hypoxia and tachycardia.  I proceeded to do CTA to look for PE.  Patient was given Toradol for her complaints of chest pain.  6:04 AM radiologist called results of her CTA of the chest.  Patient is noted to have a PE mainly on the right side, but no evidence of right heart strain.  We will have pharmacist dose for PE.  6:39 AM Dr. Thomes Dinning, hospitalist will admit.  Jo Knapp was evaluated in Emergency Department on 04/09/2020 for the symptoms described in the history of present illness. She was evaluated in the context of the global COVID-19 pandemic, which necessitated consideration that the patient might be at risk for infection with the SARS-CoV-2 virus that causes COVID-19. Institutional protocols and algorithms that  pertain to the evaluation of patients at risk for COVID-19 are in a state of rapid change based on information released by regulatory bodies including the CDC and federal and state organizations. These policies and algorithms were followed during the patient's care in the ED.  Final Clinical Impression(s) / ED Diagnoses Final diagnoses:  Other acute pulmonary embolism without acute cor pulmonale (HCC)  Hypokalemia  Right-sided chest pain  Tachypnea  Hypoxia  Pulmonary embolism associated with COVID-19 [facility])    Rx / DC Orders  Plan admission  Devoria Albe, MD, Concha Pyo, MD 04/09/20 [phone]

## 2020-04-09 NOTE — H&P (Addendum)
History and Physical    Jo Knapp JJO:[id number] DOB: 21-Jul-1960 DOA: 04/09/2020  PCP: Patient, No Pcp Per   Patient coming from: Home  Chief Complaint: Shortness of breath and R sided CP  HPI: Jo Knapp is a 59 y.o. female with medical history significant for obesity and hypertension who started having Covid symptoms around 9/5 with dry cough, fevers, and chills.  She is noted to have a positive Covid test on 9/13 and she brings paperwork with her.  She states that she also continues have some nausea and vomiting each day, but denies any diarrhea, abdominal pain, or sore throat.  Approximately 2 days ago she became more short of breath and started having right lower chest pain.  She denies any particular sick contacts and has not been at work recently.  Patient denies Covid vaccination.  She also states that she ran out of her blood pressure medication losartan and hctz 2 months ago and has not had this refilled.   ED Course: Vital signs with sinus tachycardia noted and confirmed on EKG with heart rate 118 and mild LVH.  Temperature 100 Fahrenheit.  Blood pressure elevated at 146/113.  Her Covid testing and flu swab were actually negative here.  Potassium was 3.1 and her white blood cell count is 18,000.  Sodium is 134 and lactic acid is 1.5.  Procalcitonin is 0.68.  CT PE study demonstrates distal right pulmonary artery filling defect suggestive of PE with no RV strain.  Infiltrates are noted bilaterally consistent with Covid pneumonia and dilated ascending aorta at 4.1 cm diameter is also noted.  She has been started on heparin drip for treatment.  Review of Systems: All others reviewed and otherwise negative.  Past Medical History:  Diagnosis Date  . Hypertension     Past Surgical History:  Procedure Laterality Date  . ANKLE SURGERY    . CESAREAN SECTION WITH BILATERAL TUBAL LIGATION       reports that she has never smoked. She has never used smokeless tobacco. She  reports current alcohol use. She reports previous drug use.  No Known Allergies  Family History  Problem Relation Age of Onset  . Heart disease Mother     Prior to Admission medications   Medication Sig Start Date End Date Taking? Authorizing Provider  aspirin 81 MG tablet Take 81 mg by mouth daily.    [provider]  ibuprofen (ADVIL,MOTRIN) 600 MG tablet Take 600 mg by mouth 3 (three) times daily as needed.    [provider]  losartan (COZAAR) 50 MG tablet Take 50 mg by mouth daily.    [provider]  naproxen (NAPROSYN) 500 MG tablet Take 1 tablet (500 mg total) by mouth 2 (two) times daily with a meal. 12/13/19   Tommi Rumps, PA-C    Physical Exam: Vitals:   04/09/20 0330 04/09/20 0402 04/09/20 0600 04/09/20 0630  BP: (!) 176/99 (!) 156/90 (!) 183/118 (!) 146/113  Pulse: (!) 104 (!) 120 (!) 121 (!) 118  Resp: 18 18 (!) 28 (!) 21  Temp:      TempSrc:      SpO2: 94% 91% 92% 98%  Weight:      Height:        Constitutional: NAD, calm, comfortable, obese Vitals:   04/09/20 0330 04/09/20 0402 04/09/20 0600 04/09/20 0630  BP: (!) 176/99 (!) 156/90 (!) 183/118 (!) 146/113  Pulse: (!) 104 (!) 120 (!) 121 (!) 118  Resp: 18 18 (!) 28 (!)  21  Temp:      TempSrc:      SpO2: 94% 91% 92% 98%  Weight:      Height:       Eyes: lids and conjunctivae normal ENMT: Mucous membranes are moist.  Neck: normal, supple Respiratory: clear to auscultation bilaterally. Normal respiratory effort. No accessory muscle use.  Currently on 2 L nasal cannula oxygen Cardiovascular: Regular rate and rhythm, no murmurs. No extremity edema. Abdomen: no tenderness, no distention. Bowel sounds positive.  Musculoskeletal:  No joint deformity upper and lower extremities.   Skin: no rashes, lesions, ulcers.  Psychiatric: Normal judgment and insight. Alert and oriented x 3. Normal mood.   Labs on Admission: I have personally reviewed following labs and imaging  studies  CBC: Recent Labs  Lab 04/09/20 0427  WBC 18.2*  NEUTROABS 14.2*  HGB 12.7  HCT 38.9  MCV 90.9  PLT 453*   Basic Metabolic Panel: Recent Labs  Lab 04/09/20 0427  NA 134*  K 3.1*  CL 94*  CO2 29  GLUCOSE 127*  BUN 12  CREATININE 0.65  CALCIUM 8.9   GFR: Estimated Creatinine Clearance: 89.2 mL/min (by C-G formula based on SCr of 0.65 mg/dL). Liver Function Tests: Recent Labs  Lab 04/09/20 0427  AST 32  ALT 34  ALKPHOS 103  BILITOT 1.6*  PROT 7.7  ALBUMIN 2.9*   No results for input(s): LIPASE, AMYLASE in the last 168 hours. No results for input(s): AMMONIA in the last 168 hours. Coagulation Profile: Recent Labs  Lab 04/09/20 0427  INR 1.2   Cardiac Enzymes: No results for input(s): CKTOTAL, CKMB, CKMBINDEX, TROPONINI in the last 168 hours. BNP (last 3 results) No results for input(s): PROBNP in the last 8760 hours. HbA1C: No results for input(s): HGBA1C in the last 72 hours. CBG: No results for input(s): GLUCAP in the last 168 hours. Lipid Profile: Recent Labs    04/09/20 0427  TRIG 78   Thyroid Function Tests: No results for input(s): TSH, T4TOTAL, FREET4, T3FREE, THYROIDAB in the last 72 hours. Anemia Panel: Recent Labs    04/09/20 0427  FERRITIN 576*   Urine analysis:    Component Value Date/Time   COLORURINE AMBER (A) 08/30/2018 0035   APPEARANCEUR HAZY (A) 08/30/2018 0035   APPEARANCEUR Hazy 05/07/2014 0120   LABSPEC 1.036 (H) 08/30/2018 0035   LABSPEC 1.023 05/07/2014 0120   PHURINE 5.0 08/30/2018 0035   GLUCOSEU NEGATIVE 08/30/2018 0035   GLUCOSEU Negative 05/07/2014 0120   HGBUR NEGATIVE 08/30/2018 0035   BILIRUBINUR NEGATIVE 08/30/2018 0035   BILIRUBINUR Negative 05/07/2014 0120   KETONESUR 5 (A) 08/30/2018 0035   PROTEINUR 30 (A) 08/30/2018 0035   NITRITE NEGATIVE 08/30/2018 0035   LEUKOCYTESUR MODERATE (A) 08/30/2018 0035   LEUKOCYTESUR Negative 05/07/2014 0120    Radiological Exams on Admission: CT Angio  Chest PE W/Cm &/Or Wo Cm  Result Date: 04/09/2020 CLINICAL DATA:  COVID positive patient. Shortness of breath starting today. Suspected pulmonary embolus with high probability. EXAM: CT ANGIOGRAPHY CHEST WITH CONTRAST TECHNIQUE: Multidetector CT imaging of the chest was performed using the standard protocol during bolus administration of intravenous contrast. Multiplanar CT image reconstructions and MIPs were obtained to evaluate the vascular anatomy. CONTRAST:  OMNIPAQUE IOHEXOL 350 MG/ML SOLN COMPARISON:  None. FINDINGS: Cardiovascular: Good opacification of the central and segmental pulmonary arteries. Large filling defects in the distal right lobar pulmonary artery, extending into the right upper and lower lobe segmental pulmonary arteries. Changes are consistent with acute  pulmonary embolus. The RV to LV ratio is 0.5, providing no evidence of right heart strain. No pericardial effusions. Dilated ascending aorta at 4.1 cm AP diameter. No aortic dissection. Great vessel origins are patent. Mediastinum/Nodes: Esophagus is decompressed. No significant lymphadenopathy. Lungs/Pleura: Patchy peripheral and perihilar infiltrates bilaterally consistent with COVID pneumonia or residual fibrosis. No pleural effusions. No pneumothorax. Airways are patent. Upper Abdomen: No acute process demonstrated in the visualized upper abdomen. Musculoskeletal: No chest wall abnormality. No acute or significant osseous findings. Review of the MIP images confirms the above findings. IMPRESSION: 1. Positive for acute pulmonary embolus in the distal right lobar pulmonary artery, extending into the right upper and lower lobe segmental pulmonary arteries. No evidence of right heart strain. 2. Patchy peripheral and perihilar infiltrates bilaterally consistent with COVID pneumonia or fibrosis. 3. Dilated ascending aorta at 4.1 cm AP diameter. Critical Value/emergent results were called by telephone at the time of interpretation on  04/09/2020 at 6:03 am to provider IVA KNAPP , who verbally acknowledged these results. Electronically Signed   By: Burman Nieves M.D.   On: 04/09/2020 06:06   DG Chest Portable 1 View  Result Date: 04/09/2020 CLINICAL DATA:  Generalized pain, shortness of breath, fever, and weakness. COVID positive for 12 days EXAM: PORTABLE CHEST 1 VIEW COMPARISON:  01/31/2014 FINDINGS: Normal heart size and pulmonary vascularity. Patchy infiltrates throughout the lungs with peribronchial thickening compatible with COVID pneumonia. No pleural effusions. No pneumothorax. Mediastinal contours appear intact. IMPRESSION: Patchy infiltrates throughout the lungs compatible with COVID pneumonia. Electronically Signed   By: Burman Nieves M.D.   On: 04/09/2020 00:28    EKG: Independently reviewed. ST 118bpm with LVH.  Assessment/Plan Active Problems:   Acute pulmonary embolism (HCC)    Acute hypoxemic respiratory failure secondary to acute right-sided PE -This is in setting of Covid pneumonia with possible superimposed bacterial infection -Currently on 2 L nasal cannula oxygen and does not wear oxygen at home -Heparin drip to continue and may transition to DOAC once vitals and symptomatology stable -Continue close monitoring  COVID-19 pneumonia -Testing here has returned negative, but she has had a positive test on 9/13, will repeat here and likely a false negative. -Given her current symptoms, plan to treat with remdesivir and dexamethasone -Follow inflammatory markers -Vitamin C and zinc -Symptomatic medications -Patient is unvaccinated  Possible superimposed community-acquired bacterial pneumonia -Procalcitonin elevated 0.68 with noted leukocytosis -Started on Rocephin and azithromycin -Symptomatic management as above  History of hypertension-uncontrolled -Blood pressures currently elevated -Continue home losartan and hctz, patient has been out of this medication for the last 2 months and will need  refill on discharge -Labetalol as needed for significant elevations  History of obesity -Lifestyle changes  DVT prophylaxis: Heparin drip Code Status: Full Family Communication: Patient will call family Disposition Plan:Admit for treatment of covid pna/PE Consults called:None Admission status: Inpatient, Tele   Jaelani Posa D Shannelle Alguire DO [facility]  If 7PM-7AM, please contact night-coverage [email or web address]  04/09/2020, 7:32 AM

## 2020-04-10 DIAGNOSIS — U071 COVID-19: Secondary | ICD-10-CM | POA: Diagnosis present

## 2020-04-10 DIAGNOSIS — J9601 Acute respiratory failure with hypoxia: Secondary | ICD-10-CM | POA: Diagnosis present

## 2020-04-10 DIAGNOSIS — J1282 Pneumonia due to coronavirus disease 2019: Secondary | ICD-10-CM

## 2020-04-10 DIAGNOSIS — J069 Acute upper respiratory infection, unspecified: Secondary | ICD-10-CM | POA: Diagnosis present

## 2020-04-10 LAB — CBC WITH DIFFERENTIAL/PLATELET
Abs Immature Granulocytes: 0.09 10*3/uL — ABNORMAL HIGH (ref 0.00–0.07)
Basophils Absolute: 0 10*3/uL (ref 0.0–0.1)
Basophils Relative: 0 %
Eosinophils Absolute: 0 10*3/uL (ref 0.0–0.5)
Eosinophils Relative: 0 %
HCT: 39.7 % (ref 36.0–46.0)
Hemoglobin: 12.7 g/dL (ref 12.0–15.0)
Immature Granulocytes: 1 %
Lymphocytes Relative: 8 %
Lymphs Abs: 1.2 10*3/uL (ref 0.7–4.0)
MCH: 29.5 pg (ref 26.0–34.0)
MCHC: 32 g/dL (ref 30.0–36.0)
MCV: 92.3 fL (ref 80.0–100.0)
Monocytes Absolute: 0.9 10*3/uL (ref 0.1–1.0)
Monocytes Relative: 5 %
Neutro Abs: 14.2 10*3/uL — ABNORMAL HIGH (ref 1.7–7.7)
Neutrophils Relative %: 86 %
Platelets: 392 10*3/uL (ref 150–400)
RBC: 4.3 MIL/uL (ref 3.87–5.11)
RDW: 12.8 % (ref 11.5–15.5)
WBC: 16.4 10*3/uL — ABNORMAL HIGH (ref 4.0–10.5)
nRBC: 0 % (ref 0.0–0.2)

## 2020-04-10 LAB — COMPREHENSIVE METABOLIC PANEL
ALT: 38 U/L (ref 0–44)
AST: 33 U/L (ref 15–41)
Albumin: 2.8 g/dL — ABNORMAL LOW (ref 3.5–5.0)
Alkaline Phosphatase: 112 U/L (ref 38–126)
Anion gap: 12 (ref 5–15)
BUN: 18 mg/dL (ref 6–20)
CO2: 25 mmol/L (ref 22–32)
Calcium: 8.9 mg/dL (ref 8.9–10.3)
Chloride: 98 mmol/L (ref 98–111)
Creatinine, Ser: 0.64 mg/dL (ref 0.44–1.00)
GFR calc Af Amer: >60 mL/min (ref >60)
GFR calc non Af Amer: >60 mL/min (ref >60)
Glucose, Bld: 166 mg/dL — ABNORMAL HIGH (ref 70–99)
Potassium: 3.6 mmol/L (ref 3.5–5.1)
Sodium: 135 mmol/L (ref 135–145)
Total Bilirubin: 1 mg/dL (ref 0.3–1.2)
Total Protein: 7.5 g/dL (ref 6.5–8.1)

## 2020-04-10 LAB — MAGNESIUM: Magnesium: 2 mg/dL (ref 1.7–2.4)

## 2020-04-10 LAB — GLUCOSE, CAPILLARY
Glucose-Capillary: 155 mg/dL — ABNORMAL HIGH (ref 70–99)
Glucose-Capillary: 167 mg/dL — ABNORMAL HIGH (ref 70–99)

## 2020-04-10 LAB — HEPARIN LEVEL (UNFRACTIONATED)
Heparin Unfractionated: 0.5 IU/mL (ref 0.30–0.70)
Heparin Unfractionated: 0.6 IU/mL (ref 0.30–0.70)

## 2020-04-10 LAB — C-REACTIVE PROTEIN: CRP: 15.8 mg/dL — ABNORMAL HIGH (ref <1.0)

## 2020-04-10 LAB — FERRITIN: Ferritin: 659 ng/mL — ABNORMAL HIGH (ref 11–307)

## 2020-04-10 LAB — D-DIMER, QUANTITATIVE: D-Dimer, Quant: 6.65 ug/mL-FEU — ABNORMAL HIGH (ref 0.00–0.50)

## 2020-04-10 MED ORDER — MORPHINE SULFATE (PF) 2 MG/ML IV SOLN
2.0000 mg | Freq: Once | INTRAVENOUS | Status: AC
  Administered 2020-04-10: 2 mg via INTRAVENOUS
  Filled 2020-04-10: qty 1

## 2020-04-10 MED ORDER — AMLODIPINE BESYLATE 5 MG PO TABS
2.5000 mg | ORAL_TABLET | Freq: Every day | ORAL | Status: DC
  Administered 2020-04-11 – 2020-04-12 (×2): 2.5 mg via ORAL
  Filled 2020-04-10 (×2): qty 1

## 2020-04-10 NOTE — Progress Notes (Signed)
phone charger delivered to patient.

## 2020-04-10 NOTE — Plan of Care (Signed)
  Problem: Education: Goal: Knowledge of risk factors and measures for prevention of condition will improve Outcome: Progressing   Problem: Coping: Goal: Psychosocial and spiritual needs will be supported Outcome: Progressing   Problem: Respiratory: Goal: Will maintain a patent airway Outcome: Progressing Goal: Complications related to the disease process, condition or treatment will be avoided or minimized Outcome: Progressing   

## 2020-04-10 NOTE — Progress Notes (Signed)
ANTICOAGULATION CONSULT NOTE   Pharmacy Consult for Heparin  Indication: pulmonary embolus  No Known Allergies  Patient Measurements: Height: 5\' 6"  (167.6 cm) Weight: 95.3 kg (210 lb) IBW/kg (Calculated) : 59.3  Vital Signs: BP: 153/100 (09/29 0230) Pulse Rate: 97 (09/29 0300)  Labs: Recent Labs    04/09/20 0427 04/09/20 1501 04/10/20 0048  HGB 12.7  --  12.7  HCT 38.9  --  39.7  PLT 453*  --  392  APTT 32  --   --   LABPROT 14.4  --   --   INR 1.2  --   --   HEPARINUNFRC  --  0.25* 0.50  CREATININE 0.65  --  0.64    Estimated Creatinine Clearance: 89.2 mL/min (by C-G formula based on SCr of 0.64 mg/dL).   Medical History: Past Medical History:  Diagnosis Date   Hypertension     Assessment: 59 y/o F with new onset PE in setting of COVID-19. Starting heparin. CBC/renal function good. PTA meds reviewed.   HL 0.25- slightly subtherapeutic  9/29 AM update:  Heparin level at goal  Goal of Therapy:  Heparin level 0.3-0.7 units/ml Monitor platelets by anticoagulation protocol: Yes   Plan:  Cont heparin 1350 units/hr Confirmatory heparin level with AM labs  10/29, PharmD, BCPS Clinical Pharmacist Phone: [phone]

## 2020-04-10 NOTE — Progress Notes (Signed)
Patient Demographics:    Jo Knapp, is a 59 y.o. female, DOB - 1961-04-06, BPZ:[id number]  Admit date - 04/09/2020   Admitting Physician Frankey Shown, DO  Outpatient Primary MD for the patient is Patient, No Pcp Per  LOS - 1   Chief Complaint  Patient presents with  . Shortness of Breath        Subjective:    Jo Knapp today has no fevers, no emesis,  No chest pain,  --Cough and shortness of breath persist but not worse, dyspnea on exertion is significant No diarrhea  Assessment  & Plan :    Principal Problem:   Acute pulmonary embolism (HCC) Active Problems:   Acute respiratory disease due to COVID-19 virus   Acute respiratory failure with hypoxia (HCC)   Pneumonia due to COVID-19 virus  Brief Summary:-  59 y.o. female with medical history significant for obesity and hypertension admitted on 04/09/2020 with acute hypoxic respiratory failure secondary to combination of acute pulmonary embolism and Covid pneumonia -Patient is Not vaccinated against COVID-19 infection  A/p 1) acute pulmonary embolism-----with hypoxia in the setting of COVID-19 infection/pneumonia -Oxygen, continue heparin drip =-Possible transition to Eliquis in 24 to 48 hours  2) pneumonia due to COVID-19--- -continue IV remdesivir and steroids started on 04/09/2020 A The treatment plan and use of medications  for treatment of COVID-19 infection and possible side effects were discussed with patient/family -----Patient/Family verbalizes understanding and agrees to treatment protocols   --Patient is positive for COVID-19 infection, chest x-ray with findings of infiltrates/opacities,  patient is tachypneic/hypoxic and requiring continuous supplemental oxygen---patient meets criteria for initiation of Remdesivir AND Steroid therapy per protocol  --Check and trend inflammatory markers including D-dimer, ferritin and   CRP---also follow CBC and CMP --Supplemental oxygen to keep O2 sats above 93% -Follow serial chest x-rays and ABGs as indicated --- Encourage prone positioning for More than 16 hours/day in increments of 2 to 3 hours at a time if able to tolerate --Attempt to maintain euvolemic state --Zinc and vitamin C as ordered -Albuterol inhaler as needed -Accu-Cheks/fingersticks while on high-dose steroids -PPI while on high-dose steroids COVID-19 Labs  Recent Labs    04/09/20 0427 04/10/20 0048  DDIMER 7.61* 6.65*  FERRITIN 576* 659*  LDH 277*  --   CRP 16.6* 15.8*    Lab Results  Component Value Date   SARSCOV2NAA POSITIVE (A) 04/09/2020   SARSCOV2NAA NEGATIVE 04/08/2020   3)HTN--stop losartan/HCTZ given hypokalemia and contrast study --Okay to use amlodipine instead  4)Class II Obesity- ----after resolution of acute symptoms patient can resume-Low calorie diet, portion control and increase physical activity discussed with patient -Body mass index is 33.09 kg/m.  NB -Patient empirically was started on azithromycin and Rocephin due to elevated procalcitonin and leukocytosis with concerns about possible superimposed bacterial infection   Disposition/Need for in-Hospital Stay- patient unable to be discharged at this time due to -acute hypoxic respiratory failure in the setting of Covid pneumonia and acute pulmonary embolism requiring IV steroids, IV remdesivir and IV heparin drip  Status is: Inpatient  Remains inpatient appropriate because:acute hypoxic respiratory failure in the setting of Covid pneumonia and acute pulmonary embolism requiring IV steroids, IV remdesivir and IV heparin drip   Disposition: The patient is  from: Home              Anticipated d/c is to: Home              Anticipated d/c date is: 2 days              Patient currently is not medically stable to d/c. Barriers: Not Clinically Stable- acute hypoxic respiratory failure in the setting of Covid pneumonia  and acute pulmonary embolism requiring IV steroids, IV remdesivir and IV heparin drip  Code Status : Full   Family Communication:   (patient is alert, awake and coherent)   Consults  :  na  DVT Prophylaxis  :   Iv Heparin -    Lab Results  Component Value Date   PLT 392 04/10/2020    Inpatient Medications  Scheduled Meds: . vitamin C  500 mg Oral Daily  . aspirin  81 mg Oral Daily  . dexamethasone (DECADRON) injection  6 mg Intravenous Q24H  . hydrochlorothiazide  25 mg Oral Daily  . losartan  50 mg Oral Daily  . sodium chloride flush  3 mL Intravenous Q12H  . zinc sulfate  220 mg Oral Daily   Continuous Infusions: . sodium chloride 250 mL (04/10/20 0818)  . azithromycin 500 mg (04/10/20 0918)  . cefTRIAXone (ROCEPHIN)  IV 1 g (04/10/20 0821)  . heparin 1,350 Units/hr (04/10/20 0010)  . remdesivir 100 mg in NS 100 mL 100 mg (04/10/20 0924)   PRN Meds:.sodium chloride, acetaminophen, albuterol, chlorpheniramine-HYDROcodone, guaiFENesin-dextromethorphan, labetalol, ondansetron **OR** ondansetron (ZOFRAN) IV, sodium chloride flush    Anti-infectives (From admission, onward)   Start     Dose/Rate Route Frequency Ordered Stop   04/10/20 1000  remdesivir 100 mg in sodium chloride 0.9 % 100 mL IVPB  Status:  Discontinued       "Followed by" Linked Group Details   100 mg 200 mL/hr over 30 Minutes Intravenous Daily 04/09/20 0823 04/09/20 0830   04/10/20 1000  remdesivir 100 mg in sodium chloride 0.9 % 100 mL IVPB       "Followed by" Linked Group Details   100 mg 200 mL/hr over 30 Minutes Intravenous Daily 04/09/20 0830 04/14/20 0959   04/09/20 0900  cefTRIAXone (ROCEPHIN) 1 g in sodium chloride 0.9 % 100 mL IVPB        1 g 200 mL/hr over 30 Minutes Intravenous Every 24 hours 04/09/20 0823     04/09/20 0900  azithromycin (ZITHROMAX) 500 mg in sodium chloride 0.9 % 250 mL IVPB        500 mg 250 mL/hr over 60 Minutes Intravenous Every 24 hours 04/09/20 0823     04/09/20 0830   remdesivir 200 mg in sodium chloride 0.9% 250 mL IVPB  Status:  Discontinued       "Followed by" Linked Group Details   200 mg 580 mL/hr over 30 Minutes Intravenous Once 04/09/20 0823 04/09/20 1257   04/09/20 0830  remdesivir 100 mg in sodium chloride 0.9 % 100 mL IVPB       "Followed by" Linked Group Details   100 mg 200 mL/hr over 30 Minutes Intravenous Every 1 hr x 2 04/09/20 0830 04/09/20 1636        Objective:   Vitals:   04/10/20 0830 04/10/20 0900 04/10/20 1000 04/10/20 1408  BP: (!) 146/100 (!) 145/99 (!) 167/114 127/89  Pulse: 98 93 97 92  Resp: (!) 24 (!) 24 (!) 22 18  Temp:   98.6 F (37  C) 97.8 F (36.6 C)  TempSrc:   Oral Oral  SpO2: 99% 99% 100% 98%  Weight:   93 kg   Height:   5\' 6"  (1.676 m)     Wt Readings from Last 3 Encounters:  04/10/20 93 kg  12/13/19 98.4 kg  08/30/18 95.3 kg     Intake/Output Summary (Last 24 hours) at 04/10/2020 1817 Last data filed at 04/10/2020 1700 Gross per 24 hour  Intake 358 ml  Output 1000 ml  Net -642 ml     Physical Exam  Gen:- Awake Alert, no significant conversational dyspnea, the patient has dyspnea on exertion HEENT:- [place].AT, No sclera icterus Nose- [place] 2L/min Neck-Supple Neck,No JVD,.  Lungs-diminished breath sounds, scattered rhonchi bilaterally  CV- S1, S2 normal, regular  Abd-  +ve B.Sounds, Abd Soft, No tenderness,    Extremity/Skin:- No  edema, pedal pulses present  Psych-affect is appropriate, oriented x3 Neuro-no new focal deficits, no tremors   Data Review:   Micro Results Recent Results (from the past 240 hour(s))  Respiratory Panel by RT PCR (Flu A&B, Covid) - Nasopharyngeal Swab     Status: None   Collection Time: 04/08/20 11:49 PM   Specimen: Nasopharyngeal Swab  Result Value Ref Range Status   SARS Coronavirus 2 by RT PCR NEGATIVE NEGATIVE Final    Comment: (NOTE) SARS-CoV-2 target nucleic acids are NOT DETECTED.  The SARS-CoV-2 RNA is generally detectable in upper respiratoy specimens  during the acute phase of infection. The lowest concentration of SARS-CoV-2 viral copies this assay can detect is 131 copies/mL. A negative result does not preclude SARS-Cov-2 infection and should not be used as the sole basis for treatment or other patient management decisions. A negative result may occur with  improper specimen collection/handling, submission of specimen other than nasopharyngeal swab, presence of viral mutation(s) within the areas targeted by this assay, and inadequate number of viral copies (<131 copies/mL). A negative result must be combined with clinical observations, patient history, and epidemiological information. The expected result is Negative.  Fact Sheet for Patients:  https://www.moore.com/  Fact Sheet for Healthcare Providers:  https://www.young.biz/  This test is no t yet approved or cleared by the [place] FDA and  has been authorized for detection and/or diagnosis of SARS-CoV-2 by FDA under an Emergency Use Authorization (EUA). This EUA will remain  in effect (meaning this test can be used) for the duration of the COVID-19 declaration under Section 564(b)(1) of the Act, 21 U.S.C. section 360bbb-3(b)(1), unless the authorization is terminated or revoked sooner.     Influenza A by PCR NEGATIVE NEGATIVE Final   Influenza B by PCR NEGATIVE NEGATIVE Final    Comment: (NOTE) The Xpert Xpress SARS-CoV-2/FLU/RSV assay is intended as an aid in  the diagnosis of influenza from Nasopharyngeal swab specimens and  should not be used as a sole basis for treatment. Nasal washings and  aspirates are unacceptable for Xpert Xpress SARS-CoV-2/FLU/RSV  testing.  Fact Sheet for Patients: https://www.moore.com/  Fact Sheet for Healthcare Providers: https://www.young.biz/  This test is not yet approved or cleared by the [place] FDA and  has been authorized for detection and/or  diagnosis of SARS-CoV-2 by  FDA under an Emergency Use Authorization (EUA). This EUA will remain  in effect (meaning this test can be used) for the duration of the  Covid-19 declaration under Section 564(b)(1) of the Act, 21  U.S.C. section 360bbb-3(b)(1), unless the authorization is  terminated or revoked. Performed at [facility], [address] [place], [place] [address]   Blood Culture (routine x 2)     Status: None (Preliminary result)   Collection Time: 04/09/20  4:27 AM   Specimen: BLOOD  Result Value Ref Range Status   Specimen Description BLOOD BLOOD RIGHT ARM  Final   Special Requests   Final    BOTTLES DRAWN AEROBIC AND ANAEROBIC Blood Culture adequate volume   Culture   Final    NO GROWTH 1 DAY Performed at [facility], [address] [place], [place] [address]    Report Status PENDING  Incomplete  Blood Culture (routine x 2)     Status: None (Preliminary result)   Collection Time: 04/09/20  4:32 AM   Specimen: BLOOD  Result Value Ref Range Status   Specimen Description BLOOD LEFT ANTECUBITAL  Final   Special Requests   Final    BOTTLES DRAWN AEROBIC AND ANAEROBIC Blood Culture adequate volume   Culture   Final    NO GROWTH 1 DAY Performed at [facility], [address] [place], [place] [address]    Report Status PENDING  Incomplete  Respiratory Panel by RT PCR (Flu A&B, Covid) - Nasopharyngeal Swab     Status: Abnormal   Collection Time: 04/09/20  7:49 AM   Specimen: Nasopharyngeal Swab  Result Value Ref Range Status   SARS Coronavirus 2 by RT PCR POSITIVE (A) NEGATIVE Final    Comment: RESULT CALLED TO, READ BACK BY AND VERIFIED WITH: CRAWFORD,[email or web address]@[id number]  BY JONES,T 9.28.21 (NOTE) SARS-CoV-2 target nucleic acids are DETECTED.  SARS-CoV-2 RNA is generally detectable in upper respiratory specimens  during the acute phase of infection. Positive results are indicative of the presence of the identified virus, but do not rule out bacterial infection or co-infection  with other pathogens not detected by the test. Clinical correlation with patient history and other diagnostic information is necessary to determine patient infection status. The expected result is Negative.  Fact Sheet for Patients:  https://www.moore.com/  Fact Sheet for Healthcare Providers: https://www.young.biz/  This test is not yet approved or cleared by the [place] FDA and  has been authorized for detection and/or diagnosis of SARS-CoV-2 by FDA under an Emergency Use Authorization (EUA).  This EUA will remain in effect (meaning this test can be  used) for the duration of  the COVID-19 declaration under Section 564(b)(1) of the Act, 21 U.S.C. section 360bbb-3(b)(1), unless the authorization is terminated or revoked sooner.      Influenza A by PCR NEGATIVE NEGATIVE Final   Influenza B by PCR NEGATIVE NEGATIVE Final    Comment: (NOTE) The Xpert Xpress SARS-CoV-2/FLU/RSV assay is intended as an aid in  the diagnosis of influenza from Nasopharyngeal swab specimens and  should not be used as a sole basis for treatment. Nasal washings and  aspirates are unacceptable for Xpert Xpress SARS-CoV-2/FLU/RSV  testing.  Fact Sheet for Patients: https://www.moore.com/  Fact Sheet for Healthcare Providers: https://www.young.biz/  This test is not yet approved or cleared by the [place] FDA and  has been authorized for detection and/or diagnosis of SARS-CoV-2 by  FDA under an Emergency Use Authorization (EUA). This EUA will remain  in effect (meaning this test can be used) for the duration of the  Covid-19 declaration under Section 564(b)(1) of the Act, 21  U.S.C. section 360bbb-3(b)(1), unless the authorization is  terminated or revoked. Performed at [facility], [address] [place], [place] [address]     Radiology Reports CT Angio Chest PE W/Cm &/Or Wo Cm  Result Date:  04/09/2020 CLINICAL DATA:  COVID positive patient. Shortness of breath starting today. Suspected pulmonary embolus with high probability. EXAM: CT ANGIOGRAPHY CHEST WITH CONTRAST TECHNIQUE: Multidetector CT imaging of the chest was performed using the standard protocol during bolus administration of intravenous contrast. Multiplanar CT image reconstructions and MIPs were obtained to evaluate the vascular anatomy. CONTRAST:  OMNIPAQUE IOHEXOL 350 MG/ML SOLN COMPARISON:  None. FINDINGS: Cardiovascular: Good opacification of the central and segmental pulmonary arteries. Large filling defects in the distal right lobar pulmonary artery, extending into the right upper and lower lobe segmental pulmonary arteries. Changes are consistent with acute pulmonary embolus. The RV to LV ratio is 0.5, providing no evidence of right heart strain. No pericardial effusions. Dilated ascending aorta at 4.1 cm AP diameter. No aortic dissection. Great vessel origins are patent. Mediastinum/Nodes: Esophagus is decompressed. No significant lymphadenopathy. Lungs/Pleura: Patchy peripheral and perihilar infiltrates bilaterally consistent with COVID pneumonia or residual fibrosis. No pleural effusions. No pneumothorax. Airways are patent. Upper Abdomen: No acute process demonstrated in the visualized upper abdomen. Musculoskeletal: No chest wall abnormality. No acute or significant osseous findings. Review of the MIP images confirms the above findings. IMPRESSION: 1. Positive for acute pulmonary embolus in the distal right lobar pulmonary artery, extending into the right upper and lower lobe segmental pulmonary arteries. No evidence of right heart strain. 2. Patchy peripheral and perihilar infiltrates bilaterally consistent with COVID pneumonia or fibrosis. 3. Dilated ascending aorta at 4.1 cm AP diameter. Critical Value/emergent results were called by telephone at the time of interpretation on 04/09/2020 at 6:03 am to provider IVA KNAPP ,  who verbally acknowledged these results. Electronically Signed   By: Burman Nieves M.D.   On: 04/09/2020 06:06   DG Chest Portable 1 View  Result Date: 04/09/2020 CLINICAL DATA:  Generalized pain, shortness of breath, fever, and weakness. COVID positive for 12 days EXAM: PORTABLE CHEST 1 VIEW COMPARISON:  01/31/2014 FINDINGS: Normal heart size and pulmonary vascularity. Patchy infiltrates throughout the lungs with peribronchial thickening compatible with COVID pneumonia. No pleural effusions. No pneumothorax. Mediastinal contours appear intact. IMPRESSION: Patchy infiltrates throughout the lungs compatible with COVID pneumonia. Electronically Signed   By: Burman Nieves M.D.   On: 04/09/2020 00:28     CBC Recent Labs  Lab 04/09/20 0427 04/10/20 0048  WBC 18.2* 16.4*  HGB 12.7 12.7  HCT 38.9 39.7  PLT 453* 392  MCV 90.9 92.3  MCH 29.7 29.5  MCHC 32.6 32.0  RDW 12.5 12.8  LYMPHSABS 2.0 1.2  MONOABS 1.7* 0.9  EOSABS 0.0 0.0  BASOSABS 0.1 0.0    Chemistries  Recent Labs  Lab 04/09/20 0427 04/10/20 0048  NA 134* 135  K 3.1* 3.6  CL 94* 98  CO2 29 25  GLUCOSE 127* 166*  BUN 12 18  CREATININE 0.65 0.64  CALCIUM 8.9 8.9  MG  --  2.0  AST 32 33  ALT 34 38  ALKPHOS 103 112  BILITOT 1.6* 1.0   ------------------------------------------------------------------------------------------------------------------ Recent Labs    04/09/20 0427  TRIG 78    No results found for: HGBA1C ------------------------------------------------------------------------------------------------------------------ No results for input(s): TSH, T4TOTAL, T3FREE, THYROIDAB in the last 72 hours.  Invalid input(s): FREET3 ------------------------------------------------------------------------------------------------------------------ Recent Labs    04/09/20 0427 04/10/20 0048  FERRITIN 576* 659*    Coagulation profile Recent Labs  Lab 04/09/20 0427  INR 1.2    Recent Labs     04/09/20 0427 04/10/20 0048  DDIMER 7.61* 6.65*    Cardiac  Enzymes No results for input(s): CKMB, TROPONINI, MYOGLOBIN in the last 168 hours.  Invalid input(s): CK ------------------------------------------------------------------------------------------------------------------ No results found for: BNP   Shon Hale M.D on 04/10/2020 at 6:17 PM  Go to [email or web address] - for contact info  [facility] - Office  [phone]

## 2020-04-10 NOTE — Progress Notes (Signed)
ANTICOAGULATION CONSULT NOTE -   Pharmacy Consult for Heparin  Indication: pulmonary embolus  No Known Allergies  Patient Measurements: Height: 5\' 6"  (167.6 cm) Weight: 95.3 kg (210 lb) IBW/kg (Calculated) : 59.3  Vital Signs: Temp: 98.5 F (36.9 C) (09/29 0822) Temp Source: Oral (09/29 0822) BP: 148/95 (09/29 0800) Pulse Rate: 97 (09/29 0800)  Labs: Recent Labs    04/09/20 0427 04/09/20 1501 04/10/20 0048 04/10/20 0653  HGB 12.7  --  12.7  --   HCT 38.9  --  39.7  --   PLT 453*  --  392  --   APTT 32  --   --   --   LABPROT 14.4  --   --   --   INR 1.2  --   --   --   HEPARINUNFRC  --  0.25* 0.50 0.60  CREATININE 0.65  --  0.64  --     Estimated Creatinine Clearance: 89.2 mL/min (by C-G formula based on SCr of 0.64 mg/dL).   Medical History: Past Medical History:  Diagnosis Date  . Hypertension     Assessment: 59 y/o F with new onset PE in setting of COVID-19. Starting heparin. CBC/renal function good. PTA meds reviewed.   HL 0.60- therapeutic  Goal of Therapy:  Heparin level 0.3-0.7 units/ml Monitor platelets by anticoagulation protocol: Yes   Plan:  Continue heparin drip at 1350 units/hr Heparin level daily Daily CBC Monitor for bleeding  41, PharmD Clinical Pharmacist 04/10/2020 8:38 AM

## 2020-04-11 LAB — COMPREHENSIVE METABOLIC PANEL
ALT: 24 U/L (ref 0–44)
AST: 16 U/L (ref 15–41)
Albumin: 2.6 g/dL — ABNORMAL LOW (ref 3.5–5.0)
Alkaline Phosphatase: 88 U/L (ref 38–126)
Anion gap: 9 (ref 5–15)
BUN: 21 mg/dL — ABNORMAL HIGH (ref 6–20)
CO2: 29 mmol/L (ref 22–32)
Calcium: 8.8 mg/dL — ABNORMAL LOW (ref 8.9–10.3)
Chloride: 97 mmol/L — ABNORMAL LOW (ref 98–111)
Creatinine, Ser: 0.6 mg/dL (ref 0.44–1.00)
GFR calc Af Amer: >60 mL/min (ref >60)
GFR calc non Af Amer: >60 mL/min (ref >60)
Glucose, Bld: 93 mg/dL (ref 70–99)
Potassium: 3.5 mmol/L (ref 3.5–5.1)
Sodium: 135 mmol/L (ref 135–145)
Total Bilirubin: 0.5 mg/dL (ref 0.3–1.2)
Total Protein: 6.6 g/dL (ref 6.5–8.1)

## 2020-04-11 LAB — C-REACTIVE PROTEIN: CRP: 9.6 mg/dL — ABNORMAL HIGH (ref <1.0)

## 2020-04-11 LAB — CBC WITH DIFFERENTIAL/PLATELET
Abs Immature Granulocytes: 0.06 10*3/uL (ref 0.00–0.07)
Basophils Absolute: 0 10*3/uL (ref 0.0–0.1)
Basophils Relative: 0 %
Eosinophils Absolute: 0.1 10*3/uL (ref 0.0–0.5)
Eosinophils Relative: 0 %
HCT: 35.8 % — ABNORMAL LOW (ref 36.0–46.0)
Hemoglobin: 11.3 g/dL — ABNORMAL LOW (ref 12.0–15.0)
Immature Granulocytes: 0 %
Lymphocytes Relative: 16 %
Lymphs Abs: 2.4 10*3/uL (ref 0.7–4.0)
MCH: 29.3 pg (ref 26.0–34.0)
MCHC: 31.6 g/dL (ref 30.0–36.0)
MCV: 92.7 fL (ref 80.0–100.0)
Monocytes Absolute: 1.3 10*3/uL — ABNORMAL HIGH (ref 0.1–1.0)
Monocytes Relative: 9 %
Neutro Abs: 11.2 10*3/uL — ABNORMAL HIGH (ref 1.7–7.7)
Neutrophils Relative %: 75 %
Platelets: 432 10*3/uL — ABNORMAL HIGH (ref 150–400)
RBC: 3.86 MIL/uL — ABNORMAL LOW (ref 3.87–5.11)
RDW: 12.8 % (ref 11.5–15.5)
WBC: 15.1 10*3/uL — ABNORMAL HIGH (ref 4.0–10.5)
nRBC: 0 % (ref 0.0–0.2)

## 2020-04-11 LAB — D-DIMER, QUANTITATIVE: D-Dimer, Quant: 6.4 ug/mL-FEU — ABNORMAL HIGH (ref 0.00–0.50)

## 2020-04-11 LAB — MAGNESIUM: Magnesium: 1.9 mg/dL (ref 1.7–2.4)

## 2020-04-11 LAB — FERRITIN: Ferritin: 686 ng/mL — ABNORMAL HIGH (ref 11–307)

## 2020-04-11 LAB — HEPARIN LEVEL (UNFRACTIONATED): Heparin Unfractionated: 0.54 IU/mL (ref 0.30–0.70)

## 2020-04-11 NOTE — Progress Notes (Signed)
ANTICOAGULATION CONSULT NOTE -   Pharmacy Consult for Heparin  Indication: pulmonary embolus  No Known Allergies  Patient Measurements: Height: 5\' 6"  (167.6 cm) Weight: 93 kg (205 lb 0.4 oz) IBW/kg (Calculated) : 59.3  Vital Signs: Temp: 97.6 F (36.4 C) (09/30 0509) Temp Source: Oral (09/30 0509) BP: 159/96 (09/30 0552) Pulse Rate: 85 (09/30 0509)  Labs: Recent Labs    04/09/20 0427 04/09/20 1501 04/10/20 0048 04/10/20 0653 04/11/20 0635  HGB 12.7  --  12.7  --  11.3*  HCT 38.9  --  39.7  --  35.8*  PLT 453*  --  392  --  432*  APTT 32  --   --   --   --   LABPROT 14.4  --   --   --   --   INR 1.2  --   --   --   --   HEPARINUNFRC  --    < > 0.50 0.60 0.54  CREATININE 0.65  --  0.64  --  0.60   < > = values in this interval not displayed.    Estimated Creatinine Clearance: 88.1 mL/min (by C-G formula based on SCr of 0.6 mg/dL).   Medical History: Past Medical History:  Diagnosis Date  . Hypertension     Assessment: 59 y/o F with new onset PE in setting of COVID-19. Starting heparin. CBC/renal function good. PTA meds reviewed.   HL 0.54- therapeutic  Goal of Therapy:  Heparin level 0.3-0.7 units/ml Monitor platelets by anticoagulation protocol: Yes   Plan:  Continue heparin drip at 1350 units/hr Heparin level daily Daily CBC Monitor for bleeding  41, BS Elder Cyphers, BCPS Clinical Pharmacist Pager [phone] 04/11/2020 8:11 AM

## 2020-04-11 NOTE — Progress Notes (Signed)
Patient Demographics:    Jo Knapp, is a 59 y.o. female, DOB - [birth year], LOV:[id number]  Admit date - 04/09/2020   Admitting Physician Frankey Shown, DO  Outpatient Primary MD for the patient is Patient, No Pcp Per  LOS - 2   Chief Complaint  Patient presents with  . Shortness of Breath        Subjective:    Jo Knapp today has no fevers, no emesis,  No chest pain,  --Cough and shortness of breath persist but not worse,   -Currently requiring 2 L of oxygen via nasal cannula -No bleeding concerns on IV heparin  Assessment  & Plan :    Principal Problem:   Acute pulmonary embolism (HCC) Active Problems:   Acute respiratory disease due to COVID-19 virus   Acute respiratory failure with hypoxia (HCC)   Pneumonia due to COVID-19 virus  Brief Summary:-  59 y.o. female with medical history significant for obesity and hypertension admitted on 04/09/2020 with acute hypoxic respiratory failure secondary to combination of acute pulmonary embolism and Covid pneumonia -Patient is Not vaccinated against COVID-19 infection  A/p 1) acute pulmonary embolism-----with hypoxia in the setting of COVID-19 infection/pneumonia -Oxygen, continue heparin drip -No bleeding concerns =-Possible transition to Eliquis in 24hours  2) pneumonia due to COVID-19--- -continue IV remdesivir and steroids started on 04/09/2020 A The treatment plan and use of medications  for treatment of COVID-19 infection and possible side effects were discussed with patient/family -----Patient/Family verbalizes understanding and agrees to treatment protocols  -Currently requiring 2 L of oxygen via nasal cannula --Patient is positive for COVID-19 infection, chest x-ray with findings of infiltrates/opacities,  patient is tachypneic/hypoxic and requiring continuous supplemental oxygen---patient meets criteria for initiation of  Remdesivir AND Steroid therapy per protocol  --Check and trend inflammatory markers including D-dimer, ferritin and  CRP---also follow CBC and CMP --Supplemental oxygen to keep O2 sats above 93% -Follow serial chest x-rays and ABGs as indicated --- Encourage prone positioning for More than 16 hours/day in increments of 2 to 3 hours at a time if able to tolerate --Attempt to maintain euvolemic state --Zinc and vitamin C as ordered -Albuterol inhaler as needed -Accu-Cheks/fingersticks while on high-dose steroids -PPI while on high-dose steroids COVID-19 Labs  Recent Labs    04/09/20 0427 04/10/20 0048 04/11/20 0635  DDIMER 7.61* 6.65* 6.40*  FERRITIN 576* 659* 686*  LDH 277*  --   --   CRP 16.6* 15.8* 9.6*    Lab Results  Component Value Date   SARSCOV2NAA POSITIVE (A) 04/09/2020   SARSCOV2NAA NEGATIVE 04/08/2020   3)HTN--stopped  losartan/HCTZ given hypokalemia and contrast study --Okay to use amlodipine instead  4)Class II Obesity- ----after resolution of acute symptoms patient can resume-Low calorie diet, portion control and increase physical activity discussed with patient -Body mass index is 33.09 kg/m.  NB -Patient empirically was started on azithromycin and Rocephin due to elevated procalcitonin and leukocytosis with concerns about possible superimposed bacterial infection   Disposition/Need for in-Hospital Stay- patient unable to be discharged at this time due to -acute hypoxic respiratory failure in the setting of Covid pneumonia and acute pulmonary embolism requiring IV steroids, IV remdesivir and IV heparin drip -possible dc home on 05/14/20 with home oxygen and eliquis  Status is: Inpatient  Remains inpatient appropriate because:acute hypoxic respiratory failure in the setting of Covid pneumonia and acute pulmonary embolism requiring IV steroids, IV remdesivir and IV heparin drip -possible dc home on 05/14/20 with home oxygen and eliquis  Disposition: The  patient is from: Home              Anticipated d/c is to: Home              Anticipated d/c date is: 2 days              Patient currently is not medically stable to d/c. Barriers: Not Clinically Stable- acute hypoxic respiratory failure in the setting of Covid pneumonia and acute pulmonary embolism requiring IV steroids, IV remdesivir and IV heparin drip --possible dc home on 05/14/20 with home oxygen and eliquis  Code Status : Full   Family Communication:   (patient is alert, awake and coherent)   Consults  :  na  DVT Prophylaxis  :   Iv Heparin -    Lab Results  Component Value Date   PLT 432 (H) 04/11/2020    Inpatient Medications  Scheduled Meds: . amLODipine  2.5 mg Oral Daily  . vitamin C  500 mg Oral Daily  . aspirin  81 mg Oral Daily  . dexamethasone (DECADRON) injection  6 mg Intravenous Q24H  . sodium chloride flush  3 mL Intravenous Q12H  . zinc sulfate  220 mg Oral Daily   Continuous Infusions: . sodium chloride 250 mL (04/10/20 0818)  . azithromycin 500 mg (04/11/20 1006)  . cefTRIAXone (ROCEPHIN)  IV 1 g (04/11/20 1010)  . heparin 1,350 Units/hr (04/10/20 1839)  . remdesivir 100 mg in NS 100 mL 100 mg (04/11/20 1200)   PRN Meds:.sodium chloride, acetaminophen, albuterol, chlorpheniramine-HYDROcodone, guaiFENesin-dextromethorphan, labetalol, ondansetron **OR** ondansetron (ZOFRAN) IV, sodium chloride flush    Anti-infectives (From admission, onward)   Start     Dose/Rate Route Frequency Ordered Stop   04/10/20 1000  remdesivir 100 mg in sodium chloride 0.9 % 100 mL IVPB  Status:  Discontinued       "Followed by" Linked Group Details   100 mg 200 mL/hr over 30 Minutes Intravenous Daily 04/09/20 0823 04/09/20 0830   04/10/20 1000  remdesivir 100 mg in sodium chloride 0.9 % 100 mL IVPB       "Followed by" Linked Group Details   100 mg 200 mL/hr over 30 Minutes Intravenous Daily 04/09/20 0830 04/14/20 0959   04/09/20 0900  cefTRIAXone (ROCEPHIN) 1 g in  sodium chloride 0.9 % 100 mL IVPB        1 g 200 mL/hr over 30 Minutes Intravenous Every 24 hours 04/09/20 0823     04/09/20 0900  azithromycin (ZITHROMAX) 500 mg in sodium chloride 0.9 % 250 mL IVPB        500 mg 250 mL/hr over 60 Minutes Intravenous Every 24 hours 04/09/20 0823     04/09/20 0830  remdesivir 200 mg in sodium chloride 0.9% 250 mL IVPB  Status:  Discontinued       "Followed by" Linked Group Details   200 mg 580 mL/hr over 30 Minutes Intravenous Once 04/09/20 0823 04/09/20 1257   04/09/20 0830  remdesivir 100 mg in sodium chloride 0.9 % 100 mL IVPB       "Followed by" Linked Group Details   100 mg 200 mL/hr over 30 Minutes Intravenous Every 1 hr x 2 04/09/20 0830 04/09/20 1636  Objective:   Vitals:   04/11/20 0100 04/11/20 0509 04/11/20 0552 04/11/20 1300  BP: (!) 153/95 (!) 158/117 (!) 159/96 (!) 143/101  Pulse:  85  98  Resp: 18 18    Temp: 98.4 F (36.9 C) 97.6 F (36.4 C)  98.1 F (36.7 C)  TempSrc: Oral Oral    SpO2: 98% 99%  96%  Weight:      Height:        Wt Readings from Last 3 Encounters:  04/10/20 93 kg  12/13/19 98.4 kg  08/30/18 95.3 kg     Intake/Output Summary (Last 24 hours) at 04/11/2020 1820 Last data filed at 04/11/2020 1700 Gross per 24 hour  Intake 107.98 ml  Output 1000 ml  Net -892.02 ml     Physical Exam  Gen:- Awake Alert, no acute distress at rest HEENT:- [place].AT, No sclera icterus Nose- [place] 2L/min Neck-Supple Neck,No JVD,.  Lungs-diminished breath sounds, scattered rhonchi bilaterally, no wheezing CV- S1, S2 normal, regular  Abd-  +ve B.Sounds, Abd Soft, No tenderness,    Extremity/Skin:- No  edema, pedal pulses present  Psych-affect is appropriate, oriented x3 Neuro-no new focal deficits, no tremors   Data Review:   Micro Results Recent Results (from the past 240 hour(s))  Respiratory Panel by RT PCR (Flu A&B, Covid) - Nasopharyngeal Swab     Status: None   Collection Time: 04/08/20 11:49 PM   Specimen:  Nasopharyngeal Swab  Result Value Ref Range Status   SARS Coronavirus 2 by RT PCR NEGATIVE NEGATIVE Final    Comment: (NOTE) SARS-CoV-2 target nucleic acids are NOT DETECTED.  The SARS-CoV-2 RNA is generally detectable in upper respiratoy specimens during the acute phase of infection. The lowest concentration of SARS-CoV-2 viral copies this assay can detect is 131 copies/mL. A negative result does not preclude SARS-Cov-2 infection and should not be used as the sole basis for treatment or other patient management decisions. A negative result may occur with  improper specimen collection/handling, submission of specimen other than nasopharyngeal swab, presence of viral mutation(s) within the areas targeted by this assay, and inadequate number of viral copies (<131 copies/mL). A negative result must be combined with clinical observations, patient history, and epidemiological information. The expected result is Negative.  Fact Sheet for Patients:  https://www.moore.com/  Fact Sheet for Healthcare Providers:  https://www.young.biz/  This test is no t yet approved or cleared by the [place] FDA and  has been authorized for detection and/or diagnosis of SARS-CoV-2 by FDA under an Emergency Use Authorization (EUA). This EUA will remain  in effect (meaning this test can be used) for the duration of the COVID-19 declaration under Section 564(b)(1) of the Act, 21 U.S.C. section 360bbb-3(b)(1), unless the authorization is terminated or revoked sooner.     Influenza A by PCR NEGATIVE NEGATIVE Final   Influenza B by PCR NEGATIVE NEGATIVE Final    Comment: (NOTE) The Xpert Xpress SARS-CoV-2/FLU/RSV assay is intended as an aid in  the diagnosis of influenza from Nasopharyngeal swab specimens and  should not be used as a sole basis for treatment. Nasal washings and  aspirates are unacceptable for Xpert Xpress SARS-CoV-2/FLU/RSV  testing.  Fact Sheet  for Patients: https://www.moore.com/  Fact Sheet for Healthcare Providers: https://www.young.biz/  This test is not yet approved or cleared by the [place] FDA and  has been authorized for detection and/or diagnosis of SARS-CoV-2 by  FDA under an Emergency Use Authorization (EUA). This EUA will remain  in effect (meaning this  test can be used) for the duration of the  Covid-19 declaration under Section 564(b)(1) of the Act, 21  U.S.C. section 360bbb-3(b)(1), unless the authorization is  terminated or revoked. Performed at [facility], [address] [place], [place] [address]   Blood Culture (routine x 2)     Status: None (Preliminary result)   Collection Time: 04/09/20  4:27 AM   Specimen: BLOOD  Result Value Ref Range Status   Specimen Description BLOOD BLOOD RIGHT ARM  Final   Special Requests   Final    BOTTLES DRAWN AEROBIC AND ANAEROBIC Blood Culture adequate volume   Culture   Final    NO GROWTH 2 DAYS Performed at [facility], [address], [place], [place] [address]    Report Status PENDING  Incomplete  Blood Culture (routine x 2)     Status: None (Preliminary result)   Collection Time: 04/09/20  4:32 AM   Specimen: BLOOD  Result Value Ref Range Status   Specimen Description BLOOD LEFT ANTECUBITAL  Final   Special Requests   Final    BOTTLES DRAWN AEROBIC AND ANAEROBIC Blood Culture adequate volume   Culture   Final    NO GROWTH 2 DAYS Performed at [facility], [address], [place], [place] [address]    Report Status PENDING  Incomplete  Respiratory Panel by RT PCR (Flu A&B, Covid) - Nasopharyngeal Swab     Status: Abnormal   Collection Time: 04/09/20  7:49 AM   Specimen: Nasopharyngeal Swab  Result Value Ref Range Status   SARS Coronavirus 2 by RT PCR POSITIVE (A) NEGATIVE Final    Comment: RESULT CALLED TO, READ BACK BY AND VERIFIED WITH: CRAWFORD,H@[id number]  BY JONES,T 9.28.21 (NOTE) SARS-CoV-2 target nucleic acids  are DETECTED.  SARS-CoV-2 RNA is generally detectable in upper respiratory specimens  during the acute phase of infection. Positive results are indicative of the presence of the identified virus, but do not rule out bacterial infection or co-infection with other pathogens not detected by the test. Clinical correlation with patient history and other diagnostic information is necessary to determine patient infection status. The expected result is Negative.  Fact Sheet for Patients:  https://www.moore.com/  Fact Sheet for Healthcare Providers: https://www.young.biz/  This test is not yet approved or cleared by the [place] FDA and  has been authorized for detection and/or diagnosis of SARS-CoV-2 by FDA under an Emergency Use Authorization (EUA).  This EUA will remain in effect (meaning this test can be  used) for the duration of  the COVID-19 declaration under Section 564(b)(1) of the Act, 21 U.S.C. section 360bbb-3(b)(1), unless the authorization is terminated or revoked sooner.      Influenza A by PCR NEGATIVE NEGATIVE Final   Influenza B by PCR NEGATIVE NEGATIVE Final    Comment: (NOTE) The Xpert Xpress SARS-CoV-2/FLU/RSV assay is intended as an aid in  the diagnosis of influenza from Nasopharyngeal swab specimens and  should not be used as a sole basis for treatment. Nasal washings and  aspirates are unacceptable for Xpert Xpress SARS-CoV-2/FLU/RSV  testing.  Fact Sheet for Patients: https://www.moore.com/  Fact Sheet for Healthcare Providers: https://www.young.biz/  This test is not yet approved or cleared by the [place] FDA and  has been authorized for detection and/or diagnosis of SARS-CoV-2 by  FDA under an Emergency Use Authorization (EUA). This EUA will remain  in effect (meaning this test can be used) for the duration of the  Covid-19 declaration under Section 564(b)(1) of the  Act, 21  U.S.C. section 360bbb-3(b)(1), unless the authorization is  terminated or revoked. Performed at [facility], [address] [place], [place] [address]     Radiology Reports CT Angio Chest PE W/Cm &/Or Wo Cm  Result Date: 04/09/2020 CLINICAL DATA:  COVID positive patient. Shortness of breath starting today. Suspected pulmonary embolus with high probability. EXAM: CT ANGIOGRAPHY CHEST WITH CONTRAST TECHNIQUE: Multidetector CT imaging of the chest was performed using the standard protocol during bolus administration of intravenous contrast. Multiplanar CT image reconstructions and MIPs were obtained to evaluate the vascular anatomy. CONTRAST:  OMNIPAQUE IOHEXOL 350 MG/ML SOLN COMPARISON:  None. FINDINGS: Cardiovascular: Good opacification of the central and segmental pulmonary arteries. Large filling defects in the distal right lobar pulmonary artery, extending into the right upper and lower lobe segmental pulmonary arteries. Changes are consistent with acute pulmonary embolus. The RV to LV ratio is 0.5, providing no evidence of right heart strain. No pericardial effusions. Dilated ascending aorta at 4.1 cm AP diameter. No aortic dissection. Great vessel origins are patent. Mediastinum/Nodes: Esophagus is decompressed. No significant lymphadenopathy. Lungs/Pleura: Patchy peripheral and perihilar infiltrates bilaterally consistent with COVID pneumonia or residual fibrosis. No pleural effusions. No pneumothorax. Airways are patent. Upper Abdomen: No acute process demonstrated in the visualized upper abdomen. Musculoskeletal: No chest wall abnormality. No acute or significant osseous findings. Review of the MIP images confirms the above findings. IMPRESSION: 1. Positive for acute pulmonary embolus in the distal right lobar pulmonary artery, extending into the right upper and lower lobe segmental pulmonary arteries. No evidence of right heart strain. 2. Patchy peripheral and perihilar infiltrates  bilaterally consistent with COVID pneumonia or fibrosis. 3. Dilated ascending aorta at 4.1 cm AP diameter. Critical Value/emergent results were called by telephone at the time of interpretation on 04/09/2020 at 6:03 am to provider IVA KNAPP , who verbally acknowledged these results. Electronically Signed   By: Burman Nieves M.D.   On: 04/09/2020 06:06   DG Chest Portable 1 View  Result Date: 04/09/2020 CLINICAL DATA:  Generalized pain, shortness of breath, fever, and weakness. COVID positive for 12 days EXAM: PORTABLE CHEST 1 VIEW COMPARISON:  01/31/2014 FINDINGS: Normal heart size and pulmonary vascularity. Patchy infiltrates throughout the lungs with peribronchial thickening compatible with COVID pneumonia. No pleural effusions. No pneumothorax. Mediastinal contours appear intact. IMPRESSION: Patchy infiltrates throughout the lungs compatible with COVID pneumonia. Electronically Signed   By: Burman Nieves M.D.   On: 04/09/2020 00:28     CBC Recent Labs  Lab 04/09/20 0427 04/10/20 0048 04/11/20 0635  WBC 18.2* 16.4* 15.1*  HGB 12.7 12.7 11.3*  HCT 38.9 39.7 35.8*  PLT 453* 392 432*  MCV 90.9 92.3 92.7  MCH 29.7 29.5 29.3  MCHC 32.6 32.0 31.6  RDW 12.5 12.8 12.8  LYMPHSABS 2.0 1.2 2.4  MONOABS 1.7* 0.9 1.3*  EOSABS 0.0 0.0 0.1  BASOSABS 0.1 0.0 0.0    Chemistries  Recent Labs  Lab 04/09/20 0427 04/10/20 0048 04/11/20 0635  NA 134* 135 135  K 3.1* 3.6 3.5  CL 94* 98 97*  CO2 GLUCOSE 127* 166* 93  BUN 12 18 21*  CREATININE 0.65 0.64 0.60  CALCIUM 8.9 8.9 8.8*  MG  --  2.0 1.9  AST 32 33 16  ALT 34 38 24  ALKPHOS 103 112 88  BILITOT 1.6* 1.0 0.5   ------------------------------------------------------------------------------------------------------------------ Recent Labs    04/09/20 0427  TRIG 78    No results found for:  HGBA1C ------------------------------------------------------------------------------------------------------------------ No  results for input(s): TSH, T4TOTAL, T3FREE, THYROIDAB in the last 72 hours.  Invalid input(s): FREET3 ------------------------------------------------------------------------------------------------------------------ Recent Labs    04/10/20 0048 04/11/20 0635  FERRITIN 659* 686*    Coagulation profile Recent Labs  Lab 04/09/20 0427  INR 1.2    Recent Labs    04/10/20 0048 04/11/20 0635  DDIMER 6.65* 6.40*    Cardiac Enzymes No results for input(s): CKMB, TROPONINI, MYOGLOBIN in the last 168 hours.  Invalid input(s): CK ------------------------------------------------------------------------------------------------------------------ No results found for: BNP   Shon Hale M.D on 04/11/2020 at 6:20 PM  Go to [email or web address] - for contact info  [facility] - Office  [phone]

## 2020-04-12 LAB — CBC WITH DIFFERENTIAL/PLATELET
Abs Immature Granulocytes: 0.07 10*3/uL (ref 0.00–0.07)
Basophils Absolute: 0 10*3/uL (ref 0.0–0.1)
Basophils Relative: 0 %
Eosinophils Absolute: 0 10*3/uL (ref 0.0–0.5)
Eosinophils Relative: 0 %
HCT: 34 % — ABNORMAL LOW (ref 36.0–46.0)
Hemoglobin: 11.3 g/dL — ABNORMAL LOW (ref 12.0–15.0)
Immature Granulocytes: 1 %
Lymphocytes Relative: 19 %
Lymphs Abs: 2.3 10*3/uL (ref 0.7–4.0)
MCH: 29.5 pg (ref 26.0–34.0)
MCHC: 33.2 g/dL (ref 30.0–36.0)
MCV: 88.8 fL (ref 80.0–100.0)
Monocytes Absolute: 1.1 10*3/uL — ABNORMAL HIGH (ref 0.1–1.0)
Monocytes Relative: 9 %
Neutro Abs: 8.6 10*3/uL — ABNORMAL HIGH (ref 1.7–7.7)
Neutrophils Relative %: 71 %
Platelets: 429 10*3/uL — ABNORMAL HIGH (ref 150–400)
RBC: 3.83 MIL/uL — ABNORMAL LOW (ref 3.87–5.11)
RDW: 12.3 % (ref 11.5–15.5)
WBC: 12.1 10*3/uL — ABNORMAL HIGH (ref 4.0–10.5)
nRBC: 0 % (ref 0.0–0.2)

## 2020-04-12 LAB — COMPREHENSIVE METABOLIC PANEL WITH GFR
ALT: 25 U/L (ref 0–44)
AST: 20 U/L (ref 15–41)
Albumin: 2.3 g/dL — ABNORMAL LOW (ref 3.5–5.0)
Alkaline Phosphatase: 80 U/L (ref 38–126)
Anion gap: 8 (ref 5–15)
BUN: 13 mg/dL (ref 6–20)
CO2: 28 mmol/L (ref 22–32)
Calcium: 8.5 mg/dL — ABNORMAL LOW (ref 8.9–10.3)
Chloride: 97 mmol/L — ABNORMAL LOW (ref 98–111)
Creatinine, Ser: 0.48 mg/dL (ref 0.44–1.00)
GFR calc Af Amer: >60 mL/min
GFR calc non Af Amer: >60 mL/min
Glucose, Bld: 95 mg/dL (ref 70–99)
Potassium: 3.1 mmol/L — ABNORMAL LOW (ref 3.5–5.1)
Sodium: 133 mmol/L — ABNORMAL LOW (ref 135–145)
Total Bilirubin: 0.5 mg/dL (ref 0.3–1.2)
Total Protein: 6.3 g/dL — ABNORMAL LOW (ref 6.5–8.1)

## 2020-04-12 LAB — D-DIMER, QUANTITATIVE: D-Dimer, Quant: 4.78 ug/mL-FEU — ABNORMAL HIGH (ref 0.00–0.50)

## 2020-04-12 LAB — MAGNESIUM: Magnesium: 1.8 mg/dL (ref 1.7–2.4)

## 2020-04-12 LAB — C-REACTIVE PROTEIN: CRP: 8.1 mg/dL — ABNORMAL HIGH

## 2020-04-12 LAB — HEPARIN LEVEL (UNFRACTIONATED): Heparin Unfractionated: 0.44 IU/mL (ref 0.30–0.70)

## 2020-04-12 LAB — FERRITIN: Ferritin: 682 ng/mL — ABNORMAL HIGH (ref 11–307)

## 2020-04-12 MED ORDER — ASCORBIC ACID 500 MG PO TABS
500.0000 mg | ORAL_TABLET | Freq: Every day | ORAL | 1 refills | Status: DC
Start: 2020-04-13 — End: 2021-02-22

## 2020-04-12 MED ORDER — APIXABAN 5 MG PO TABS
10.0000 mg | ORAL_TABLET | Freq: Two times a day (BID) | ORAL | Status: DC
  Administered 2020-04-12: 10 mg via ORAL

## 2020-04-12 MED ORDER — APIXABAN 5 MG PO TABS: 5.0000 mg | ORAL_TABLET | Freq: Two times a day (BID) | ORAL | Status: DC

## 2020-04-12 MED ORDER — POTASSIUM CHLORIDE ER 20 MEQ PO TBCR: 20.0000 meq | EXTENDED_RELEASE_TABLET | ORAL | 1 refills | Status: DC

## 2020-04-12 MED ORDER — HYDROCHLOROTHIAZIDE 25 MG PO TABS
12.5000 mg | ORAL_TABLET | Freq: Every day | ORAL | 3 refills | Status: DC
Start: 2020-04-12 — End: 2021-02-22

## 2020-04-12 MED ORDER — ELIQUIS DVT/PE STARTER PACK 5 MG PO TBPK: ORAL_TABLET | ORAL | 0 refills | Status: DC

## 2020-04-12 MED ORDER — APIXABAN 5 MG PO TABS: 5.0000 mg | ORAL_TABLET | Freq: Two times a day (BID) | ORAL | 4 refills | Status: DC

## 2020-04-12 MED ORDER — ZINC SULFATE 220 (50 ZN) MG PO CAPS: 220.0000 mg | ORAL_CAPSULE | Freq: Every day | ORAL | 3 refills | Status: DC

## 2020-04-12 MED ORDER — PREDNISONE 20 MG PO TABS: 40.0000 mg | ORAL_TABLET | Freq: Every day | ORAL | 0 refills | Status: DC

## 2020-04-12 MED ORDER — POTASSIUM CHLORIDE CRYS ER 20 MEQ PO TBCR
40.0000 meq | EXTENDED_RELEASE_TABLET | ORAL | Status: DC
  Administered 2020-04-12: 40 meq via ORAL
  Filled 2020-04-12: qty 2

## 2020-04-12 MED ORDER — OMEPRAZOLE 20 MG PO CPDR: 20.0000 mg | DELAYED_RELEASE_CAPSULE | Freq: Every day | ORAL | 0 refills | Status: DC

## 2020-04-12 NOTE — Progress Notes (Signed)
AVS given to pt and reviewed.  All questions addressed at this time.  Pt has called her ride home, they are bringing clothes to change in to.  IV x2 removed without complication.  Informed pt to call me when ride is here and I will get her clothes, help her get ready, and help her to car.

## 2020-04-12 NOTE — Progress Notes (Signed)
ANTICOAGULATION CONSULT NOTE -   Pharmacy Consult for Heparin  Indication: pulmonary embolus  No Known Allergies  Patient Measurements: Height: 5\' 6"  (167.6 cm) Weight: 93 kg (205 lb 0.4 oz) IBW/kg (Calculated) : 59.3  Vital Signs: Temp: 98.6 F (37 C) (10/01 0512) Temp Source: Axillary (10/01 0512) BP: 158/82 (10/01 0512) Pulse Rate: 85 (10/01 0512)  Labs: Recent Labs    04/10/20 0048 04/10/20 0048 04/10/20 0653 04/11/20 0635 04/12/20 0415  HGB 12.7   < >  --  11.3* 11.3*  HCT 39.7  --   --  35.8* 34.0*  PLT 392  --   --  432* 429*  HEPARINUNFRC 0.50   < > 0.60 0.54 0.44  CREATININE 0.64  --   --  0.60 0.48   < > = values in this interval not displayed.    Estimated Creatinine Clearance: 88.1 mL/min (by C-G formula based on SCr of 0.48 mg/dL).   Medical History: Past Medical History:  Diagnosis Date  . Hypertension     Assessment: 59 y/o F with new onset PE in setting of COVID-19. Starting heparin. CBC/renal function good. PTA meds reviewed.   HL 0.44- therapeutic CBC WNL  Goal of Therapy:  Heparin level 0.3-0.7 units/ml Monitor platelets by anticoagulation protocol: Yes   Plan:  Continue heparin drip at 1350 units/hr Heparin level daily Daily CBC Monitor for bleeding  41, PharmD Clinical Pharmacist 04/12/2020 7:59 AM

## 2020-04-12 NOTE — Discharge Summary (Signed)
Jo Knapp, is a 59 y.o. female  DOB 07/20/1960  MRN [id number].  Admission date:  04/09/2020  Admitting Physician  Frankey Shown, DO  Discharge Date:  04/12/2020   Primary MD  Patient, No Pcp Per  Recommendations for primary care physician for things to follow:   1)You are taking Apixaban/Eliquis so Please Avoid ibuprofen/Advil/Aleve/Motrin/Goody Powders/Naproxen/BC powders/Meloxicam/Diclofenac/Indomethacin and other Nonsteroidal anti-inflammatory medications as these will make you more likely to bleed and can cause stomach ulcers, can also cause Kidney problems.   2) follow-up with the primary care physician in 3 weeks for repeat CBC and CMP blood test  3) You are strongly advised to  to isolate/quarantine for at least 21 days from the date of your diagnoses with COVID-19 infection--please always wear a mask if you have to go outside the house  4) call if blood in the stool, nosebleeds or very dark stools----  5)Please decreased Hydrochlorothiazide/HCTZ to 12.5 mg daily due to concerns about persistently low potassium levels   Admission Diagnosis  Tachypnea [R06.82] Hypokalemia [E87.6] Hypoxia [R09.02] Acute pulmonary embolism (HCC) [I26.99] Right-sided chest pain [R07.9] Other acute pulmonary embolism without acute cor pulmonale (HCC) [I26.99] Pulmonary embolism associated with COVID-19 (HCC) [U07.1, I26.99]   Discharge Diagnosis  Tachypnea [R06.82] Hypokalemia [E87.6] Hypoxia [R09.02] Acute pulmonary embolism (HCC) [I26.99] Right-sided chest pain [R07.9] Other acute pulmonary embolism without acute cor pulmonale (HCC) [I26.99] Pulmonary embolism associated with COVID-19 (HCC) [U07.1, I26.99]    Principal Problem:   Acute pulmonary embolism (HCC) Active Problems:   Acute respiratory disease due to COVID-19 virus   Acute respiratory failure with hypoxia (HCC)   Pneumonia due to  COVID-19 virus      Past Medical History:  Diagnosis Date   Hypertension     Past Surgical History:  Procedure Laterality Date   ANKLE SURGERY     CESAREAN SECTION WITH BILATERAL TUBAL LIGATION       HPI  from the history and physical done on the day of admission:   Chief Complaint: Shortness of breath and R sided CP  HPI: Jo Knapp is a 59 y.o. female with medical history significant for obesity and hypertension who started having Covid symptoms around 9/5 with dry cough, fevers, and chills.  She is noted to have a positive Covid test on 9/13 and she brings paperwork with her.  She states that she also continues have some nausea and vomiting each day, but denies any diarrhea, abdominal pain, or sore throat.  Approximately 2 days ago she became more short of breath and started having right lower chest pain.  She denies any particular sick contacts and has not been at work recently.  Patient denies Covid vaccination.  She also states that she ran out of her blood pressure medication losartan and hctz 2 months ago and has not had this refilled.   ED Course: Vital signs with sinus tachycardia noted and confirmed on EKG with heart rate 118 and mild LVH.  Temperature 100 Fahrenheit.  Blood pressure elevated at 146/113.  Her Covid testing  and flu swab were actually negative here.  Potassium was 3.1 and her white blood cell count is 18,000.  Sodium is 134 and lactic acid is 1.5.  Procalcitonin is 0.68.  CT PE study demonstrates distal right pulmonary artery filling defect suggestive of PE with no RV strain.  Infiltrates are noted bilaterally consistent with Covid pneumonia and dilated ascending aorta at 4.1 cm diameter is also noted.  She has been started on heparin drip for treatment.     Hospital Course:     Brief Summary:- 59 y.o.femalewith medical history significant forobesity and hypertension admitted on 04/09/2020 with acute hypoxic respiratory failure secondary to  combination of acute pulmonary embolism and Covid pneumonia -Patient is Not vaccinated against COVID-19 infection  A/p 1) acute pulmonary embolism-----with hypoxia in the setting of COVID-19 infection/pneumonia -Treated with heparin drip -No bleeding concerns -Discharge home on p.o. Eliquis -Patient has been weaned off oxygen  2)Pneumonia due to COVID-19--- Treated with IV remdesivir and steroids started on 04/09/2020  The treatment plan and use of medications for treatment of COVID-19 infectionand possible side effects were discussed with patient/family -----Patient/Family verbalizes understanding and agrees to treatment protocols -hypoxia resolved, patient weaned off oxygen --Patient is positive for COVID-19 infection, chest x-ray with findings of infiltrates/opacities,  patient is tachypneic/hypoxic and requiring continuous supplemental oxygen---patient meets criteria for initiation of Remdesivir AND Steroid therapy per protocol  --Supplemental oxygen to keep O2 sats above 93% -Follow serial chest x-rays and ABGs as indicated --Attempt to maintain euvolemic state --Zinc and vitamin C as ordered -Albuterol inhaler as needed COVID-19 Labs  Recent Labs    04/10/20 0048 04/11/20 0635 04/12/20 0415  DDIMER 6.65* 6.40* 4.78*  FERRITIN 659* 686* 682*  CRP 15.8* 9.6* 8.1*    Lab Results  Component Value Date   SARSCOV2NAA POSITIVE (A) 04/09/2020   SARSCOV2NAA NEGATIVE 04/08/2020   3)HTN--stopped  losartan/HCTZ given hypokalemia and contrast study --Okay to use amlodipine instead  4)Class II Obesity- ----after resolution of acute symptoms patient can resume-Low calorie diet, portion control and increase physical activity discussed with patient -Body mass index is 33.09 kg/m.  NB--- No evidence of significant bacterial infection, okay to discontinue antibiotics  Discharge Condition: stable without hypoxia  Follow UP---pcp recheck and reevaluation  Consults obtained -  na  Diet and Activity recommendation:  As advised  Discharge Instructions    Discharge Instructions    Call MD for:  difficulty breathing, headache or visual disturbances   Complete by: As directed    Call MD for:  persistant dizziness or light-headedness   Complete by: As directed    Call MD for:  persistant nausea and vomiting   Complete by: As directed    Call MD for:  severe uncontrolled pain   Complete by: As directed    Call MD for:  temperature >100.4   Complete by: As directed    Diet - low sodium heart healthy   Complete by: As directed    Discharge instructions   Complete by: As directed    1)You are taking Apixaban/Eliquis so Please Avoid ibuprofen/Advil/Aleve/Motrin/Goody Powders/Naproxen/BC powders/Meloxicam/Diclofenac/Indomethacin and other Nonsteroidal anti-inflammatory medications as these will make you more likely to bleed and can cause stomach ulcers, can also cause Kidney problems.   2) follow-up with the primary care physician in 3 weeks for repeat CBC and CMP blood test  3) You are strongly advised to  to isolate/quarantine for at least 21 days from the date of your diagnoses with COVID-19 infection--please always wear  a mask if you have to go outside the house  4) call if blood in the stool, nosebleeds or very dark stools----  5)Please decreased Hydrochlorothiazide/HCTZ to 12.5 mg daily due to concerns about persistently low potassium levels   Increase activity slowly   Complete by: As directed        Discharge Medications     Allergies as of 04/12/2020   No Known Allergies     Medication List    STOP taking these medications   aspirin 81 MG tablet   naproxen 500 MG tablet Commonly known as: Naprosyn     TAKE these medications   ascorbic acid 500 MG tablet Commonly known as: VITAMIN C Take 1 tablet (500 mg total) by mouth daily. Start taking on: April 13, 2020   Eliquis DVT/PE Starter Pack Generic drug: Apixaban Starter Pack (10mg  and  5mg ) Take as directed on package: start with two-5mg  tablets twice daily for 7 days. On day 8, switch to one-5mg  tablet twice daily.   apixaban 5 MG Tabs tablet Commonly known as: ELIQUIS Take 1 tablet (5 mg total) by mouth 2 (two) times daily. Start around 05/12/20 after completing initial started pack Start taking on: May 12, 2020   hydrochlorothiazide 25 MG tablet Commonly known as: HYDRODIURIL Take 0.5 tablets (12.5 mg total) by mouth daily. For BP What changed:   how much to take  additional instructions   losartan 50 MG tablet Commonly known as: COZAAR Take 50 mg by mouth daily.   omeprazole 20 MG capsule Commonly known as: PriLOSEC Take 1 capsule (20 mg total) by mouth daily.   Potassium Chloride ER 20 MEQ Tbcr Take 20 mEq by mouth every Monday, Wednesday, and Friday. While taking HCTZ   predniSONE 20 MG tablet Commonly known as: Deltasone Take 2 tablets (40 mg total) by mouth daily with breakfast.   zinc sulfate 220 (50 Zn) MG capsule Take 1 capsule (220 mg total) by mouth daily. Start taking on: April 13, 2020       Major procedures and Radiology Reports - PLEASE review detailed and final reports for all details, in brief -   CT Angio Chest PE W/Cm &/Or Wo Cm  Result Date: 04/09/2020 CLINICAL DATA:  COVID positive patient. Shortness of breath starting today. Suspected pulmonary embolus with high probability. EXAM: CT ANGIOGRAPHY CHEST WITH CONTRAST TECHNIQUE: Multidetector CT imaging of the chest was performed using the standard protocol during bolus administration of intravenous contrast. Multiplanar CT image reconstructions and MIPs were obtained to evaluate the vascular anatomy. CONTRAST:  April 15, 2020 OMNIPAQUE IOHEXOL 350 MG/ML SOLN COMPARISON:  None. FINDINGS: Cardiovascular: Good opacification of the central and segmental pulmonary arteries. Large filling defects in the distal right lobar pulmonary artery, extending into the right upper and lower lobe  segmental pulmonary arteries. Changes are consistent with acute pulmonary embolus. The RV to LV ratio is 0.5, providing no evidence of right heart strain. No pericardial effusions. Dilated ascending aorta at 4.1 cm AP diameter. No aortic dissection. Great vessel origins are patent. Mediastinum/Nodes: Esophagus is decompressed. No significant lymphadenopathy. Lungs/Pleura: Patchy peripheral and perihilar infiltrates bilaterally consistent with COVID pneumonia or residual fibrosis. No pleural effusions. No pneumothorax. Airways are patent. Upper Abdomen: No acute process demonstrated in the visualized upper abdomen. Musculoskeletal: No chest wall abnormality. No acute or significant osseous findings. Review of the MIP images confirms the above findings. IMPRESSION: 1. Positive for acute pulmonary embolus in the distal right lobar pulmonary artery, extending into the right upper and  lower lobe segmental pulmonary arteries. No evidence of right heart strain. 2. Patchy peripheral and perihilar infiltrates bilaterally consistent with COVID pneumonia or fibrosis. 3. Dilated ascending aorta at 4.1 cm AP diameter. Critical Value/emergent results were called by telephone at the time of interpretation on 04/09/2020 at 6:03 am to provider IVA KNAPP , who verbally acknowledged these results. Electronically Signed   By: Burman Nieves M.D.   On: 04/09/2020 06:06   DG Chest Portable 1 View  Result Date: 04/09/2020 CLINICAL DATA:  Generalized pain, shortness of breath, fever, and weakness. COVID positive for 12 days EXAM: PORTABLE CHEST 1 VIEW COMPARISON:  01/31/2014 FINDINGS: Normal heart size and pulmonary vascularity. Patchy infiltrates throughout the lungs with peribronchial thickening compatible with COVID pneumonia. No pleural effusions. No pneumothorax. Mediastinal contours appear intact. IMPRESSION: Patchy infiltrates throughout the lungs compatible with COVID pneumonia. Electronically Signed   By: Burman Nieves  M.D.   On: 04/09/2020 00:28    Micro Results    Recent Results (from the past 240 hour(s))  Respiratory Panel by RT PCR (Flu A&B, Covid) - Nasopharyngeal Swab     Status: None   Collection Time: 04/08/20 11:49 PM   Specimen: Nasopharyngeal Swab  Result Value Ref Range Status   SARS Coronavirus 2 by RT PCR NEGATIVE NEGATIVE Final    Comment: (NOTE) SARS-CoV-2 target nucleic acids are NOT DETECTED.  The SARS-CoV-2 RNA is generally detectable in upper respiratoy specimens during the acute phase of infection. The lowest concentration of SARS-CoV-2 viral copies this assay can detect is 131 copies/mL. A negative result does not preclude SARS-Cov-2 infection and should not be used as the sole basis for treatment or other patient management decisions. A negative result may occur with  improper specimen collection/handling, submission of specimen other than nasopharyngeal swab, presence of viral mutation(s) within the areas targeted by this assay, and inadequate number of viral copies (<131 copies/mL). A negative result must be combined with clinical observations, patient history, and epidemiological information. The expected result is Negative.  Fact Sheet for Patients:  https://www.moore.com/  Fact Sheet for Healthcare Providers:  https://www.young.biz/  This test is no t yet approved or cleared by the [place] FDA and  has been authorized for detection and/or diagnosis of SARS-CoV-2 by FDA under an Emergency Use Authorization (EUA). This EUA will remain  in effect (meaning this test can be used) for the duration of the COVID-19 declaration under Section 564(b)(1) of the Act, 21 U.S.C. section 360bbb-3(b)(1), unless the authorization is terminated or revoked sooner.     Influenza A by PCR NEGATIVE NEGATIVE Final   Influenza B by PCR NEGATIVE NEGATIVE Final    Comment: (NOTE) The Xpert Xpress SARS-CoV-2/FLU/RSV assay is intended as an  aid in  the diagnosis of influenza from Nasopharyngeal swab specimens and  should not be used as a sole basis for treatment. Nasal washings and  aspirates are unacceptable for Xpert Xpress SARS-CoV-2/FLU/RSV  testing.  Fact Sheet for Patients: https://www.moore.com/  Fact Sheet for Healthcare Providers: https://www.young.biz/  This test is not yet approved or cleared by the [place] FDA and  has been authorized for detection and/or diagnosis of SARS-CoV-2 by  FDA under an Emergency Use Authorization (EUA). This EUA will remain  in effect (meaning this test can be used) for the duration of the  Covid-19 declaration under Section 564(b)(1) of the Act, 21  U.S.C. section 360bbb-3(b)(1), unless the authorization is  terminated or revoked. Performed at [facility], [address] [place],  [place] [address]   Blood Culture (routine x 2)     Status: None (Preliminary result)   Collection Time: 04/09/20  4:27 AM   Specimen: BLOOD  Result Value Ref Range Status   Specimen Description BLOOD BLOOD RIGHT ARM  Final   Special Requests   Final    BOTTLES DRAWN AEROBIC AND ANAEROBIC Blood Culture adequate volume   Culture   Final    NO GROWTH 3 DAYS Performed at [facility], [address], [place], [place] [address]    Report Status PENDING  Incomplete  Blood Culture (routine x 2)     Status: None (Preliminary result)   Collection Time: 04/09/20  4:32 AM   Specimen: BLOOD  Result Value Ref Range Status   Specimen Description BLOOD LEFT ANTECUBITAL  Final   Special Requests   Final    BOTTLES DRAWN AEROBIC AND ANAEROBIC Blood Culture adequate volume   Culture   Final    NO GROWTH 3 DAYS Performed at [facility], [address] [place], [place] [address]    Report Status PENDING  Incomplete  Respiratory Panel by RT PCR (Flu A&B, Covid) - Nasopharyngeal Swab     Status: Abnormal   Collection Time: 04/09/20  7:49 AM   Specimen: Nasopharyngeal  Swab  Result Value Ref Range Status   SARS Coronavirus 2 by RT PCR POSITIVE (A) NEGATIVE Final    Comment: RESULT CALLED TO, READ BACK BY AND VERIFIED WITH: CRAWFORD,H@[id number]  BY JONES,T 9.28.21 (NOTE) SARS-CoV-2 target nucleic acids are DETECTED.  SARS-CoV-2 RNA is generally detectable in upper respiratory specimens  during the acute phase of infection. Positive results are indicative of the presence of the identified virus, but do not rule out bacterial infection or co-infection with other pathogens not detected by the test. Clinical correlation with patient history and other diagnostic information is necessary to determine patient infection status. The expected result is Negative.  Fact Sheet for Patients:  https://www.moore.com/  Fact Sheet for Healthcare Providers: https://www.young.biz/  This test is not yet approved or cleared by the [place] FDA and  has been authorized for detection and/or diagnosis of SARS-CoV-2 by FDA under an Emergency Use Authorization (EUA).  This EUA will remain in effect (meaning this test can be  used) for the duration of  the COVID-19 declaration under Section 564(b)(1) of the Act, 21 U.S.C. section 360bbb-3(b)(1), unless the authorization is terminated or revoked sooner.      Influenza A by PCR NEGATIVE NEGATIVE Final   Influenza B by PCR NEGATIVE NEGATIVE Final    Comment: (NOTE) The Xpert Xpress SARS-CoV-2/FLU/RSV assay is intended as an aid in  the diagnosis of influenza from Nasopharyngeal swab specimens and  should not be used as a sole basis for treatment. Nasal washings and  aspirates are unacceptable for Xpert Xpress SARS-CoV-2/FLU/RSV  testing.  Fact Sheet for Patients: https://www.moore.com/  Fact Sheet for Healthcare Providers: https://www.young.biz/  This test is not yet approved or cleared by the [place] FDA and  has been authorized for  detection and/or diagnosis of SARS-CoV-2 by  FDA under an Emergency Use Authorization (EUA). This EUA will remain  in effect (meaning this test can be used) for the duration of the  Covid-19 declaration under Section 564(b)(1) of the Act, 21  U.S.C. section 360bbb-3(b)(1), unless the authorization is  terminated or revoked. Performed at [facility], [address] [place], [place] [address]     Today   Subjective    Socorro Kanitz today has  no new complaints No fever  Or chills  -Ambulating on room air without hypoxia          Patient has been seen and examined prior to discharge   Objective   Blood pressure (!) 158/82, pulse 85, temperature 98.6 F (37 C), temperature source Axillary, resp. rate 16, height 5\' 6"  (1.676 m), weight 93 kg, SpO2 98 %.   Intake/Output Summary (Last 24 hours) at 04/12/2020 1548 Last data filed at 04/11/2020 2300 Gross per 24 hour  Intake 240 ml  Output 1400 ml  Net -1160 ml    Exam Gen:- Awake Alert, no acute distress  HEENT:- [place].AT, No sclera icterus Neck-Supple Neck,No JVD,.  Lungs-improved air movement, no wheezing  CV- S1, S2 normal, regular Abd-  +ve B.Sounds, Abd Soft, No tenderness,    Extremity/Skin:- No  edema,   good pulses Psych-affect is appropriate, oriented x3 Neuro-no new focal deficits, no tremors    Data Review   CBC w Diff:  Lab Results  Component Value Date   WBC 12.1 (H) 04/12/2020   HGB 11.3 (L) 04/12/2020   HGB 13.8 05/07/2014   HCT 34.0 (L) 04/12/2020   HCT 42.6 05/07/2014   PLT 429 (H) 04/12/2020   PLT 269 05/07/2014   LYMPHOPCT 19 04/12/2020   LYMPHOPCT 30.9 05/07/2014   MONOPCT 9 04/12/2020   MONOPCT 7.4 05/07/2014   EOSPCT 0 04/12/2020   EOSPCT 0.4 05/07/2014   BASOPCT 0 04/12/2020   BASOPCT 0.7 05/07/2014    CMP:  Lab Results  Component Value Date   NA 133 (L) 04/12/2020   NA 140 05/07/2014   K 3.1 (L) 04/12/2020   K 3.4 (L) 05/07/2014   CL 97 (L) 04/12/2020   CL 99 05/07/2014   CO2  28 04/12/2020   CO2 33 (H) 05/07/2014   BUN 13 04/12/2020   BUN 18 05/07/2014   CREATININE 0.48 04/12/2020   CREATININE 0.92 05/07/2014   PROT 6.3 (L) 04/12/2020   ALBUMIN 2.3 (L) 04/12/2020   BILITOT 0.5 04/12/2020   ALKPHOS 80 04/12/2020   AST 20 04/12/2020   ALT 25 04/12/2020  .   Total Discharge time is about 33 minutes  06/12/2020 M.D on 04/12/2020 at 3:48 PM  Go to [email or web address] -  for contact info  [facility] - Office  [phone]

## 2020-04-12 NOTE — Discharge Instructions (Signed)
--  1)You are taking Apixaban/Eliquis so Please Avoid ibuprofen/Advil/Aleve/Motrin/Goody Powders/Naproxen/BC powders/Meloxicam/Diclofenac/Indomethacin and other Nonsteroidal anti-inflammatory medications as these will make you more likely to bleed and can cause stomach ulcers, can also cause Kidney problems.   2) follow-up with the primary care physician in 3 weeks for repeat CBC and CMP blood test  3) You are strongly advised to  to isolate/quarantine for at least 21 days from the date of your diagnoses with COVID-19 infection--please always wear a mask if you have to go outside the house  4) call if blood in the stool, nosebleeds or very dark stools----  5)Please decreased Hydrochlorothiazide/HCTZ to 12.5 mg daily due to concerns about persistently low potassium levels    Information on my medicine - ELIQUIS (apixaban)  This medication education was reviewed with me or my healthcare representative as part of my discharge preparation.    Why was Eliquis prescribed for you? Eliquis was prescribed to treat blood clots that may have been found in the veins of your legs (deep vein thrombosis) or in your lungs (pulmonary embolism) and to reduce the risk of them occurring again.  What do You need to know about Eliquis ? The starting dose is 10 mg (two 5 mg tablets) taken TWICE daily for the FIRST SEVEN (7) DAYS, then on 04/19/2020  the dose is reduced to ONE 5 mg tablet taken TWICE daily.  Eliquis may be taken with or without food.   Try to take the dose about the same time in the morning and in the evening. If you have difficulty swallowing the tablet whole please discuss with your pharmacist how to take the medication safely.  Take Eliquis exactly as prescribed and DO NOT stop taking Eliquis without talking to the doctor who prescribed the medication.  Stopping may increase your risk of developing a new blood clot.  Refill your prescription before you run out.  After discharge, you should  have regular check-up appointments with your healthcare provider that is prescribing your Eliquis.    What do you do if you miss a dose? If a dose of ELIQUIS is not taken at the scheduled time, take it as soon as possible on the same day and twice-daily administration should be resumed. The dose should not be doubled to make up for a missed dose.  Important Safety Information A possible side effect of Eliquis is bleeding. You should call your healthcare provider right away if you experience any of the following: ? Bleeding from an injury or your nose that does not stop. ? Unusual colored urine (red or dark brown) or unusual colored stools (red or black). ? Unusual bruising for unknown reasons. ? A serious fall or if you hit your head (even if there is no bleeding).  Some medicines may interact with Eliquis and might increase your risk of bleeding or clotting while on Eliquis. To help avoid this, consult your healthcare provider or pharmacist prior to using any new prescription or non-prescription medications, including herbals, vitamins, non-steroidal anti-inflammatory drugs (NSAIDs) and supplements.  This website has more information on Eliquis (apixaban): http://www.eliquis.com/eliquis/home

## 2020-04-14 LAB — CULTURE, BLOOD (ROUTINE X 2)
Culture: NO GROWTH
Culture: NO GROWTH
Special Requests: ADEQUATE
Special Requests: ADEQUATE

## 2021-02-18 ENCOUNTER — Emergency Department (HOSPITAL_COMMUNITY): Payer: 59

## 2021-02-18 ENCOUNTER — Inpatient Hospital Stay (HOSPITAL_COMMUNITY)
Admission: EM | Admit: 2021-02-18 | Discharge: 2021-02-22 | DRG: 308 | Disposition: A | Discharge status: Home / Self Care | Payer: 59 | Attending: Family Medicine | Admitting: Family Medicine

## 2021-02-18 ENCOUNTER — Other Ambulatory Visit: Payer: Self-pay

## 2021-02-18 DIAGNOSIS — Z86718 Personal history of other venous thrombosis and embolism: Secondary | ICD-10-CM

## 2021-02-18 DIAGNOSIS — I110 Hypertensive heart disease with heart failure: Secondary | ICD-10-CM | POA: Diagnosis present

## 2021-02-18 DIAGNOSIS — I2699 Other pulmonary embolism without acute cor pulmonale: Secondary | ICD-10-CM | POA: Diagnosis present

## 2021-02-18 DIAGNOSIS — I1A0 Resistant hypertension: Secondary | ICD-10-CM | POA: Diagnosis present

## 2021-02-18 DIAGNOSIS — I160 Hypertensive urgency: Secondary | ICD-10-CM | POA: Diagnosis present

## 2021-02-18 DIAGNOSIS — I10 Essential (primary) hypertension: Secondary | ICD-10-CM | POA: Diagnosis present

## 2021-02-18 DIAGNOSIS — R140 Abdominal distension (gaseous): Secondary | ICD-10-CM | POA: Diagnosis present

## 2021-02-18 DIAGNOSIS — I509 Heart failure, unspecified: Secondary | ICD-10-CM

## 2021-02-18 DIAGNOSIS — I480 Paroxysmal atrial fibrillation: Principal | ICD-10-CM | POA: Diagnosis present

## 2021-02-18 DIAGNOSIS — Z7901 Long term (current) use of anticoagulants: Secondary | ICD-10-CM

## 2021-02-18 DIAGNOSIS — Z8701 Personal history of pneumonia (recurrent): Secondary | ICD-10-CM

## 2021-02-18 DIAGNOSIS — R0602 Shortness of breath: Secondary | ICD-10-CM | POA: Diagnosis not present

## 2021-02-18 DIAGNOSIS — I498 Other specified cardiac arrhythmias: Secondary | ICD-10-CM

## 2021-02-18 DIAGNOSIS — I5043 Acute on chronic combined systolic (congestive) and diastolic (congestive) heart failure: Secondary | ICD-10-CM | POA: Diagnosis present

## 2021-02-18 DIAGNOSIS — I255 Ischemic cardiomyopathy: Secondary | ICD-10-CM | POA: Diagnosis present

## 2021-02-18 DIAGNOSIS — Z8249 Family history of ischemic heart disease and other diseases of the circulatory system: Secondary | ICD-10-CM

## 2021-02-18 DIAGNOSIS — Z7952 Long term (current) use of systemic steroids: Secondary | ICD-10-CM

## 2021-02-18 DIAGNOSIS — I4891 Unspecified atrial fibrillation: Secondary | ICD-10-CM | POA: Diagnosis present

## 2021-02-18 DIAGNOSIS — R7401 Elevation of levels of liver transaminase levels: Secondary | ICD-10-CM | POA: Diagnosis present

## 2021-02-18 DIAGNOSIS — Z28310 Unvaccinated for covid-19: Secondary | ICD-10-CM

## 2021-02-18 DIAGNOSIS — D735 Infarction of spleen: Secondary | ICD-10-CM | POA: Diagnosis present

## 2021-02-18 DIAGNOSIS — Z8616 Personal history of COVID-19: Secondary | ICD-10-CM

## 2021-02-18 DIAGNOSIS — R748 Abnormal levels of other serum enzymes: Secondary | ICD-10-CM | POA: Diagnosis present

## 2021-02-18 DIAGNOSIS — E876 Hypokalemia: Secondary | ICD-10-CM | POA: Diagnosis present

## 2021-02-18 DIAGNOSIS — Z79899 Other long term (current) drug therapy: Secondary | ICD-10-CM

## 2021-02-18 DIAGNOSIS — Z20822 Contact with and (suspected) exposure to covid-19: Secondary | ICD-10-CM | POA: Diagnosis present

## 2021-02-18 DIAGNOSIS — I77810 Thoracic aortic ectasia: Secondary | ICD-10-CM | POA: Diagnosis present

## 2021-02-18 DIAGNOSIS — Z86711 Personal history of pulmonary embolism: Secondary | ICD-10-CM

## 2021-02-18 DIAGNOSIS — I447 Left bundle-branch block, unspecified: Secondary | ICD-10-CM | POA: Diagnosis present

## 2021-02-18 DIAGNOSIS — R109 Unspecified abdominal pain: Secondary | ICD-10-CM

## 2021-02-18 HISTORY — DX: Paroxysmal atrial fibrillation: I48.0

## 2021-02-18 HISTORY — DX: Unspecified systolic (congestive) heart failure: I50.20

## 2021-02-18 HISTORY — DX: Heart disease, unspecified: I51.9

## 2021-02-18 LAB — BASIC METABOLIC PANEL
Anion gap: 9 (ref 5–15)
BUN: 13 mg/dL (ref 6–20)
CO2: 24 mmol/L (ref 22–32)
Calcium: 8.8 mg/dL — ABNORMAL LOW (ref 8.9–10.3)
Chloride: 105 mmol/L (ref 98–111)
Creatinine, Ser: 0.79 mg/dL (ref 0.44–1.00)
GFR, Estimated: >60 mL/min (ref >60)
Glucose, Bld: 113 mg/dL — ABNORMAL HIGH (ref 70–99)
Potassium: 3.2 mmol/L — ABNORMAL LOW (ref 3.5–5.1)
Sodium: 138 mmol/L (ref 135–145)

## 2021-02-18 LAB — CBC
HCT: 42.8 % (ref 36.0–46.0)
Hemoglobin: 14.1 g/dL (ref 12.0–15.0)
MCH: 30.7 pg (ref 26.0–34.0)
MCHC: 32.9 g/dL (ref 30.0–36.0)
MCV: 93.2 fL (ref 80.0–100.0)
Platelets: 297 10*3/uL (ref 150–400)
RBC: 4.59 MIL/uL (ref 3.87–5.11)
RDW: 14.6 % (ref 11.5–15.5)
WBC: 8.6 10*3/uL (ref 4.0–10.5)
nRBC: 0 % (ref 0.0–0.2)

## 2021-02-18 LAB — TSH: TSH: 2.122 u[IU]/mL (ref 0.350–4.500)

## 2021-02-18 LAB — TROPONIN I (HIGH SENSITIVITY)
Troponin I (High Sensitivity): 4 ng/L (ref <18)
Troponin I (High Sensitivity): 4 ng/L (ref <18)

## 2021-02-18 LAB — MAGNESIUM: Magnesium: 1.9 mg/dL (ref 1.7–2.4)

## 2021-02-18 MED ORDER — IOHEXOL 350 MG/ML SOLN
100.0000 mL | Freq: Once | INTRAVENOUS | Status: AC | PRN
  Administered 2021-02-18: 80 mL via INTRAVENOUS

## 2021-02-18 MED ORDER — METOPROLOL TARTRATE 5 MG/5ML IV SOLN
5.0000 mg | Freq: Once | INTRAVENOUS | Status: AC
  Administered 2021-02-18: 5 mg via INTRAVENOUS
  Filled 2021-02-18: qty 5

## 2021-02-18 NOTE — ED Triage Notes (Signed)
Pt. States they are having SHOB. Pt. States they are unable to eat. Pt. States they are nauseous, and have abdominal pain.

## 2021-02-18 NOTE — ED Provider Notes (Signed)
[facility] EMERGENCY DEPARTMENT Provider Note   CSN: [id number] Arrival date & time: 02/18/21  1821     History Chief Complaint  Patient presents with   Shortness of Breath    Jo Knapp is a 60 y.o. female.   Shortness of Breath Associated symptoms: abdominal pain and vomiting   Associated symptoms: no chest pain   Patient presents with shortness of breath.  Has had for a few days now.  More shortness of breath.  For around a week now has had abdominal pain.  States in the lower abdomen.  Comes and goes.  Had some difficulty eating because of it.  Has some nauseousness.  States it is really not in the upper abdomen.  Will sometimes feel her heart racing and going slow also.  This is been the last few days.  No history of atrial fibrillation as far as patient knows.  Did have pulmonary embolism back in September related to COVID.  No dysuria.    Past Medical History:  Diagnosis Date   Hypertension     Patient Active Problem List   Diagnosis Date Noted   Acute respiratory disease due to COVID-19 virus 04/10/2020   Acute respiratory failure with hypoxia (HCC) 04/10/2020   Pneumonia due to COVID-19 virus 04/10/2020   Acute pulmonary embolism (HCC) 04/09/2020    Past Surgical History:  Procedure Laterality Date   ANKLE SURGERY     CESAREAN SECTION WITH BILATERAL TUBAL LIGATION       OB History   No obstetric history on file.     Family History  Problem Relation Age of Onset   Heart disease Mother     Social History   Tobacco Use   Smoking status: Never   Smokeless tobacco: Never  Substance Use Topics   Alcohol use: Yes   Drug use: Not Currently    Home Medications Prior to Admission medications   Medication Sig Start Date End Date Taking? Authorizing Provider  apixaban (ELIQUIS) 5 MG TABS tablet Take 1 tablet (5 mg total) by mouth 2 (two) times daily. Start around 05/12/20 after completing initial started pack 05/12/20  Yes Emokpae, Courage, MD   losartan (COZAAR) 50 MG tablet Take 50 mg by mouth daily.   Yes [provider]  Apixaban Starter Pack, 10mg  and 5mg , (ELIQUIS DVT/PE STARTER PACK) Take as directed on package: start with two-5mg  tablets twice daily for 7 days. On day 8, switch to one-5mg  tablet twice daily. Patient not taking: No sig reported 04/12/20   , MD  ascorbic acid (VITAMIN C) 500 MG tablet Take 1 tablet (500 mg total) by mouth daily. Patient not taking: No sig reported 04/13/20   Shon Hale, MD  hydrochlorothiazide (HYDRODIURIL) 25 MG tablet Take 0.5 tablets (12.5 mg total) by mouth daily. For BP Patient not taking: No sig reported 04/12/20   Shon Hale, MD  omeprazole (PRILOSEC) 20 MG capsule Take 1 capsule (20 mg total) by mouth daily. Patient not taking: No sig reported 04/12/20 04/12/21  06/12/20, MD  Potassium Chloride ER 20 MEQ TBCR Take 20 mEq by mouth every Monday, Wednesday, and Friday. While taking HCTZ Patient not taking: No sig reported 04/12/20   Monday, MD  predniSONE (DELTASONE) 20 MG tablet Take 2 tablets (40 mg total) by mouth daily with breakfast. Patient not taking: No sig reported 04/12/20   Shon Hale, MD  zinc sulfate 220 (50 Zn) MG capsule Take 1 capsule (220 mg total) by mouth daily. Patient  not taking: No sig reported 04/13/20   Shon Hale, MD    Allergies    Patient has no known allergies.  Review of Systems   Review of Systems  Constitutional:  Positive for appetite change.  HENT:  Negative for congestion.   Respiratory:  Positive for shortness of breath.   Cardiovascular:  Positive for palpitations. Negative for chest pain.  Gastrointestinal:  Positive for abdominal pain, nausea and vomiting.  Musculoskeletal:  Negative for back pain.  Neurological:  Negative for weakness.  Psychiatric/Behavioral:  Negative for confusion.    Physical Exam Updated Vital Signs BP (!) 188/138   Pulse (!) 41   Temp 98.3 F (36.8 C)  (Oral)   Resp (!) 24   Ht 5\' 6"  (1.676 m)   Wt 95.3 kg   SpO2 97%   BMI 33.89 kg/m   Physical Exam Vitals and nursing note reviewed.  Constitutional:      Appearance: She is well-developed.  HENT:     Head: Atraumatic.  Cardiovascular:     Rate and Rhythm: Normal rate and regular rhythm.  Pulmonary:     Breath sounds: No wheezing, rhonchi or rales.  Chest:     Chest wall: No tenderness.  Abdominal:     Tenderness: There is abdominal tenderness.     Comments: Lower abdominal tenderness without rebound or guarding.  No hernias palpated.  Skin:    Capillary Refill: Capillary refill takes less than 2 seconds.  Neurological:     Mental Status: She is oriented to person, place, and time.    ED Results / Procedures / Treatments   Labs (all labs ordered are listed, but only abnormal results are displayed) Labs Reviewed  BASIC METABOLIC PANEL - Abnormal; Notable for the following components:      Result Value   Potassium 3.2 (*)    Glucose, Bld 113 (*)    Calcium 8.8 (*)    All other components within normal limits  CBC  TSH  MAGNESIUM  TROPONIN I (HIGH SENSITIVITY)  TROPONIN I (HIGH SENSITIVITY)    EKG EKG Interpretation  Date/Time:  Tuesday February 18 2021 20:47:58 EDT Ventricular Rate:  149 PR Interval:  224 QRS Duration: 101 QT Interval:  326 QTC Calculation: 514 R Axis:   -28 Text Interpretation: Afib with RVR Borderline left axis deviation Probable anterolateral infarct, old Prolonged QT interval Confirmed by [id number] [phone]) on 02/18/2021 11:16:19 PM  Radiology CT ABDOMEN PELVIS W CONTRAST  Result Date: 02/18/2021 CLINICAL DATA:  Abdominal pain and nausea. EXAM: CT ABDOMEN AND PELVIS WITH CONTRAST TECHNIQUE: Multidetector CT imaging of the abdomen and pelvis was performed using the standard protocol following bolus administration of intravenous contrast. CONTRAST:  18mL OMNIPAQUE IOHEXOL 350 MG/ML SOLN COMPARISON:  None. FINDINGS: Lower chest: Small  right and tiny left pleural effusions. Hepatobiliary: No suspicious focal abnormality within the liver parenchyma. Gallbladder wall thickening and/or trace pericholecystic fluid noted. No calcified gallstones evident by CT. No intrahepatic or extrahepatic biliary dilation. Pancreas: No focal mass lesion. No dilatation of the main duct. No intraparenchymal cyst. No peripancreatic edema. Spleen: Wedge-shaped subcapsular focus of hypoperfusion in the inferior spleen (well seen coronal image 65/series 5) is compatible with infarct. Adrenals/Urinary Tract: No adrenal nodule or mass. 11 mm probable cyst noted interpolar right kidney with no suspicious enhancing abnormality evident in either kidney. No evidence for hydroureter. The urinary bladder appears normal for the degree of distention. Stomach/Bowel: Stomach is nondistended. Duodenum is normally positioned as is the ligament  of Treitz. No small bowel wall thickening. No small bowel dilatation. The terminal ileum is normal. The appendix is normal. No gross colonic mass. No colonic wall thickening. No substantial diverticular disease evident. No findings of diverticulitis. Vascular/Lymphatic: No abdominal aortic aneurysm. No abdominal aortic atherosclerotic calcification. There is no gastrohepatic or hepatoduodenal ligament lymphadenopathy. No retroperitoneal or mesenteric lymphadenopathy. No pelvic sidewall lymphadenopathy. Reproductive: The uterus is unremarkable.  There is no adnexal mass. Other: There is some trace fluid in the posterior pelvis, indeterminate. Musculoskeletal: No worrisome lytic or sclerotic osseous abnormality. Degenerative changes are noted in the hips and at the symphysis pubis. IMPRESSION: 1. Small, wedge-shaped subcapsular focus of hypoperfusion in the inferior spleen compatible with infarct. 2. Gallbladder wall thickening and/or trace pericholecystic fluid. No calcified gallstones evident by CT. Right upper quadrant ultrasound suggested to  further evaluate. 3. Small right and tiny left pleural effusions. 4. Trace fluid in the posterior pelvis, indeterminate. Electronically Signed   By: Kennith Center M.D.   On: 02/18/2021 22:03   DG Chest Port 1 View  Result Date: 02/18/2021 CLINICAL DATA:  Shortness of breath EXAM: PORTABLE CHEST 1 VIEW COMPARISON:  04/10/2019 FINDINGS: Cardiac shadow is mildly prominent but accentuated by the portable technique. The lungs are well aerated bilaterally. Mild interstitial changes are seen felt to be of a chronic nature given their long-term stability. No focal infiltrate is noted. No sizable effusion is seen. No bony abnormality is noted. IMPRESSION: No acute abnormality noted. Electronically Signed   By: Alcide Clever M.D.   On: 02/18/2021 19:29    Procedures Procedures   Medications Ordered in ED Medications  metoprolol tartrate (LOPRESSOR) injection 5 mg (has no administration in time range)  iohexol (OMNIPAQUE) 350 MG/ML injection 100 mL (80 mLs Intravenous Contrast Given 02/18/21 2139)    ED Course  I have reviewed the triage vital signs and the nursing notes.  Pertinent labs & imaging results that were available during my care of the patient were reviewed by me and considered in my medical decision making (see chart for details).    MDM Rules/Calculators/A&P                           Patient presents with A. fib which is new onset.  Has been going back and forth in and out of it.  Also will go into a ventricular bigeminy which she is currently in.  States has been having shortness of breath and more fatigue.  States she feels short of breath when she lays back.  Also has had abdominal pain.  The abdominal pain has been going for around a week now.  Pain however is in the lower abdomen.  CT scan done and showed nonspecific possible gallbladder findings LFTs initially not done due to no upper abdominal pain but will be added.  However also found to have possible splenic infarct.  With the A. fib  this is possible.  Patient is already on anticoagulation from pulmonary embolism during COVID about a year ago.  States she has missed some doses however.  Will admit to hospitalist.  Chads vasc 5 Final Clinical Impression(s) / ED Diagnoses Final diagnoses:  SOB (shortness of breath)    Rx / DC Orders ED Discharge Orders     None        Benjiman Core, MD 02/18/21 2318

## 2021-02-19 ENCOUNTER — Observation Stay (HOSPITAL_BASED_OUTPATIENT_CLINIC_OR_DEPARTMENT_OTHER): Payer: 59

## 2021-02-19 ENCOUNTER — Encounter (HOSPITAL_COMMUNITY): Payer: Self-pay | Admitting: Family Medicine

## 2021-02-19 ENCOUNTER — Observation Stay (HOSPITAL_COMMUNITY): Payer: 59

## 2021-02-19 DIAGNOSIS — I4891 Unspecified atrial fibrillation: Secondary | ICD-10-CM

## 2021-02-19 LAB — HEPATIC FUNCTION PANEL
ALT: 350 U/L — ABNORMAL HIGH (ref 0–44)
AST: 177 U/L — ABNORMAL HIGH (ref 15–41)
Albumin: 3.9 g/dL (ref 3.5–5.0)
Alkaline Phosphatase: 133 U/L — ABNORMAL HIGH (ref 38–126)
Bilirubin, Direct: 0.2 mg/dL (ref 0.0–0.2)
Indirect Bilirubin: 0.8 mg/dL (ref 0.3–0.9)
Total Bilirubin: 1 mg/dL (ref 0.3–1.2)
Total Protein: 7.2 g/dL (ref 6.5–8.1)

## 2021-02-19 LAB — COMPREHENSIVE METABOLIC PANEL
ALT: 308 U/L — ABNORMAL HIGH (ref 0–44)
AST: 153 U/L — ABNORMAL HIGH (ref 15–41)
Albumin: 3.7 g/dL (ref 3.5–5.0)
Alkaline Phosphatase: 121 U/L (ref 38–126)
Anion gap: 5 (ref 5–15)
BUN: 13 mg/dL (ref 6–20)
CO2: 27 mmol/L (ref 22–32)
Calcium: 8.1 mg/dL — ABNORMAL LOW (ref 8.9–10.3)
Chloride: 105 mmol/L (ref 98–111)
Creatinine, Ser: 0.84 mg/dL (ref 0.44–1.00)
GFR, Estimated: >60 mL/min (ref >60)
Glucose, Bld: 133 mg/dL — ABNORMAL HIGH (ref 70–99)
Potassium: 3.4 mmol/L — ABNORMAL LOW (ref 3.5–5.1)
Sodium: 137 mmol/L (ref 135–145)
Total Bilirubin: 1 mg/dL (ref 0.3–1.2)
Total Protein: 7 g/dL (ref 6.5–8.1)

## 2021-02-19 LAB — CBC
HCT: 41.3 % (ref 36.0–46.0)
Hemoglobin: 13.5 g/dL (ref 12.0–15.0)
MCH: 30.5 pg (ref 26.0–34.0)
MCHC: 32.7 g/dL (ref 30.0–36.0)
MCV: 93.4 fL (ref 80.0–100.0)
Platelets: 280 10*3/uL (ref 150–400)
RBC: 4.42 MIL/uL (ref 3.87–5.11)
RDW: 14.6 % (ref 11.5–15.5)
WBC: 8.4 10*3/uL (ref 4.0–10.5)
nRBC: 0 % (ref 0.0–0.2)

## 2021-02-19 LAB — ECHOCARDIOGRAM COMPLETE
AR max vel: 2.58 cm2
AV Area VTI: 3.03 cm2
AV Area mean vel: 2.37 cm2
AV Mean grad: 2 mmHg
AV Peak grad: 3 mmHg
Ao pk vel: 0.87 m/s
Area-P 1/2: 4.15 cm2
Calc EF: 35.9 %
Height: 66 in
MV VTI: 2.99 cm2
S' Lateral: 4.59 cm
Single Plane A2C EF: 34 %
Single Plane A4C EF: 36.6 %
Weight: 3360 oz

## 2021-02-19 LAB — RESP PANEL BY RT-PCR (FLU A&B, COVID) ARPGX2
Influenza A by PCR: NEGATIVE
Influenza B by PCR: NEGATIVE
SARS Coronavirus 2 by RT PCR: NEGATIVE

## 2021-02-19 LAB — MAGNESIUM: Magnesium: 1.9 mg/dL (ref 1.7–2.4)

## 2021-02-19 LAB — LIPASE, BLOOD: Lipase: 35 U/L (ref 11–51)

## 2021-02-19 MED ORDER — APIXABAN 5 MG PO TABS
5.0000 mg | ORAL_TABLET | Freq: Two times a day (BID) | ORAL | Status: DC
  Administered 2021-02-19 – 2021-02-22 (×7): 5 mg via ORAL
  Filled 2021-02-19 (×7): qty 1

## 2021-02-19 MED ORDER — OXYCODONE HCL 5 MG PO TABS
5.0000 mg | ORAL_TABLET | ORAL | Status: DC | PRN
  Administered 2021-02-19 (×2): 5 mg via ORAL
  Filled 2021-02-19 (×2): qty 1

## 2021-02-19 MED ORDER — POTASSIUM CHLORIDE 10 MEQ/100ML IV SOLN
10.0000 meq | INTRAVENOUS | Status: AC
Start: 2021-02-19 — End: 2021-02-19
  Administered 2021-02-19 (×3): 10 meq via INTRAVENOUS
  Filled 2021-02-19 (×3): qty 100

## 2021-02-19 MED ORDER — LOSARTAN POTASSIUM 50 MG PO TABS
50.0000 mg | ORAL_TABLET | Freq: Every day | ORAL | Status: DC
  Administered 2021-02-19 – 2021-02-20 (×2): 50 mg via ORAL
  Filled 2021-02-19 (×2): qty 1

## 2021-02-19 MED ORDER — DILTIAZEM HCL 25 MG/5ML IV SOLN
10.0000 mg | Freq: Once | INTRAVENOUS | Status: AC
  Administered 2021-02-19: 10 mg via INTRAVENOUS
  Filled 2021-02-19: qty 5

## 2021-02-19 MED ORDER — ONDANSETRON HCL 4 MG/2ML IJ SOLN
4.0000 mg | Freq: Four times a day (QID) | INTRAMUSCULAR | Status: DC | PRN
  Administered 2021-02-19 (×2): 4 mg via INTRAVENOUS
  Filled 2021-02-19 (×2): qty 2

## 2021-02-19 MED ORDER — MORPHINE SULFATE (PF) 2 MG/ML IV SOLN
2.0000 mg | INTRAVENOUS | Status: DC | PRN
  Administered 2021-02-20: 2 mg via INTRAVENOUS
  Filled 2021-02-19: qty 1

## 2021-02-19 MED ORDER — METOPROLOL TARTRATE 5 MG/5ML IV SOLN
5.0000 mg | Freq: Once | INTRAVENOUS | Status: AC
  Administered 2021-02-19: 5 mg via INTRAVENOUS

## 2021-02-19 MED ORDER — AMLODIPINE BESYLATE 5 MG PO TABS
5.0000 mg | ORAL_TABLET | Freq: Every day | ORAL | Status: DC
  Administered 2021-02-19 – 2021-02-20 (×2): 5 mg via ORAL
  Filled 2021-02-19 (×2): qty 1

## 2021-02-19 MED ORDER — ONDANSETRON HCL 4 MG PO TABS
4.0000 mg | ORAL_TABLET | Freq: Four times a day (QID) | ORAL | Status: DC | PRN
  Filled 2021-02-19: qty 1

## 2021-02-19 MED ORDER — METOPROLOL TARTRATE 5 MG/5ML IV SOLN
5.0000 mg | Freq: Four times a day (QID) | INTRAVENOUS | Status: DC | PRN
  Filled 2021-02-19: qty 5

## 2021-02-19 MED ORDER — ACETAMINOPHEN 325 MG PO TABS: 650.0000 mg | ORAL_TABLET | Freq: Four times a day (QID) | ORAL | Status: DC | PRN

## 2021-02-19 MED ORDER — ACETAMINOPHEN 650 MG RE SUPP: 650.0000 mg | Freq: Four times a day (QID) | RECTAL | Status: DC | PRN

## 2021-02-19 NOTE — Progress Notes (Signed)
  PROGRESS NOTE  Patient admitted earlier this morning. See H&P.   Jo Knapp  is a 60 y.o. female, with history of hypertension, and PE, presents the ED with a chief complaint of dyspnea.  Patient reports that she has had dyspnea, nausea, vomiting, lower abdominal pain, and dizziness.  CT abdomen shows small wedge-shaped subscapular focus hypoperfusion in the inferior spleen compatible with infarct.  Metoprolol given in the ED to abort A. fib RVR.  On examination, patient states that she continues to have some left-sided abdominal pain.  She has converted back to normal sinus rhythm.  No pain in the right upper quadrant.  Continue Eliquis, A. fib resolved and now in normal sinus rhythm. Monitor on tele. Right upper quadrant ultrasound unremarkable Echocardiogram pending Replace potassium LFTs improving, trend   Status is: Observation  The patient will require care spanning > 2 midnights and should be moved to inpatient because: Inpatient level of care appropriate due to severity of illness  Dispo: The patient is from: Home              Anticipated d/c is to: Home              Patient currently is not medically stable to d/c.   Difficult to place patient No       Noralee Stain, DO [facility] 02/19/2021, 11:46 AM  Available via Epic secure chat 7am-7pm After these hours, please refer to coverage provider listed on [email or web address]

## 2021-02-19 NOTE — H&P (Signed)
TRH H&P    Patient Demographics:    Jo Knapp, is a 60 y.o. female  MRN: [id number]  DOB - May 02, 1961  Admit Date - 02/18/2021  Referring MD/NP/PA: Rubin Payor  Outpatient Primary MD for the patient is Oneal Grout, FNP  Patient coming from: Home  Chief complaint- dyspnea   HPI:    Jo Knapp  is a 60 y.o. female, with history of hypertension, and PE, presents the ED with a chief complaint of dyspnea.  Patient reports that she has had dyspnea, nausea, vomiting, lower abdominal pain, and dizziness.  This started a week ago.  She thought it was a stomach virus at first.  Last normal meal was more than a week ago.  Her last normal bowel movement was also more than a week ago.  She did have loose stools yesterday.  They were nonbloody, no melena.  Her lower abdominal pain comes in waves.  No provocative factors have been noticed.  She reports the pain feels like bad contractions.  Nothing relieves the pain.  The last 2 to 3 minutes and then it is gone spontaneously.  She has had this pain every day for a week.,  Approximately 5-6 times per day.  Patient also reports that she has been having palpitations.  This started 2 days ago.  Its worse with exertion.  Also worse with laying down.  Sitting up makes the palpitations go away.  The palpitations are associated with her dyspnea but she is not sure if the dyspnea makes the palpitations worse or if the palpitations make the dyspnea worse.  Patient has no peripheral edema.  She has no known irregular heartbeat.  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  Patient also reports dizziness and headache.  She reports that she does not take her blood pressure at home.  She has taken Tylenol and its helped the intensity of her headache.  She had a change in her vision where she feels like she can see a reflection of the side of her body.  She has had  no blind spots, floaters, blurry vision.  She reports that she has been wearing her glasses at home.  Patient has no other complaints at this time.  Patient does not smoke, she drinks wine once every 2 weeks, she does not use illicit drugs.  Patient is not vaccinated for COVID.  Patient is full code.  In the ED Temp 98.3, heart rate 41-153, respiratory rate 20-35, blood pressure highest 200/112, satting 97% No leukocytosis with a white blood cell count of 8.6, hemoglobin 14.1 Chemistry panel shows a slight hypokalemia at 3.2 Troponin 4, 4 TSH 2.122 CT abdomen shows small wedge-shaped subscapular focus hypoperfusion in the inferior spleen compatible with infarct.  Gallbladder wall thickening and or trace pericholecystic fluid.  Right upper quadrant ultrasound recommended. Chest x-ray shows no acute abnormality noted Metoprolol given in the ED to abort A. fib RVR   Review of systems:    In addition to the HPI above,  No Fever-chills,  No Headache, No changes with Vision or hearing, No problems swallowing food or Liquids, Admits to dyspnea Admits to abdominal pain, nausea, vomiting, loose bowel movements, No Blood in stool or Urine, No dysuria, No new skin rashes or bruises, No new joints pains-aches,  No new weakness, tingling, numbness in any extremity, No recent weight gain or loss, No polyuria, polydypsia or polyphagia, No significant Mental Stressors.  All other systems reviewed and are negative.    Past History of the following :    Past Medical History:  Diagnosis Date   Hypertension       Past Surgical History:  Procedure Laterality Date   ANKLE SURGERY     CESAREAN SECTION WITH BILATERAL TUBAL LIGATION        Social History:      Social History   Tobacco Use   Smoking status: Never   Smokeless tobacco: Never  Substance Use Topics   Alcohol use: Yes       Family History :     Family History  Problem Relation Age of Onset   Heart disease Mother        Home Medications:   Prior to Admission medications   Medication Sig Start Date End Date Taking? Authorizing Provider  apixaban (ELIQUIS) 5 MG TABS tablet Take 1 tablet (5 mg total) by mouth 2 (two) times daily. Start around 05/12/20 after completing initial started pack 05/12/20  Yes Emokpae, Courage, MD  losartan (COZAAR) 50 MG tablet Take 50 mg by mouth daily.   Yes [provider]  Apixaban Starter Pack, 10mg  and 5mg , (ELIQUIS DVT/PE STARTER PACK) Take as directed on package: start with two-5mg  tablets twice daily for 7 days. On day 8, switch to one-5mg  tablet twice daily. Patient not taking: No sig reported 04/12/20   Shon Hale, MD  ascorbic acid (VITAMIN C) 500 MG tablet Take 1 tablet (500 mg total) by mouth daily. Patient not taking: No sig reported 04/13/20   Shon Hale, MD  hydrochlorothiazide (HYDRODIURIL) 25 MG tablet Take 0.5 tablets (12.5 mg total) by mouth daily. For BP Patient not taking: No sig reported 04/12/20   Shon Hale, MD  omeprazole (PRILOSEC) 20 MG capsule Take 1 capsule (20 mg total) by mouth daily. Patient not taking: No sig reported 04/12/20 04/12/21  Shon Hale, MD  Potassium Chloride ER 20 MEQ TBCR Take 20 mEq by mouth every Monday, Wednesday, and Friday. While taking HCTZ Patient not taking: No sig reported 04/12/20   Shon Hale, MD  predniSONE (DELTASONE) 20 MG tablet Take 2 tablets (40 mg total) by mouth daily with breakfast. Patient not taking: No sig reported 04/12/20   Shon Hale, MD  zinc sulfate 220 (50 Zn) MG capsule Take 1 capsule (220 mg total) by mouth daily. Patient not taking: No sig reported 04/13/20   Shon Hale, MD     Allergies:    No Known Allergies   Physical Exam:   Vitals  Blood pressure (!) 170/116, pulse 92, temperature 98.3 F (36.8 C), temperature source Oral, resp. rate (!) 22, height 5\' 6"  (1.676 m), weight 95.3 kg, SpO2 100 %.   1.  General: Patient lying supine in bed,   no acute distress   2. Psychiatric: Alert and oriented x 3, mood and behavior normal for situation, pleasant and cooperative with exam   3. Neurologic: Speech and language are normal, face is symmetric, moves all 4 extremities voluntarily, at baseline without acute deficits on limited exam   4. HEENMT:  Head is atraumatic, normocephalic, pupils reactive to light, neck is supple, trachea is midline, mucous membranes are moist   5. Respiratory : Lungs are clear to auscultation bilaterally without wheezing, rhonchi, rales, no cyanosis, no increase in work of breathing or accessory muscle use   6. Cardiovascular : Heart rate normal, rhythm is regular, no murmurs, rubs or gallops, no peripheral edema, peripheral pulses palpated   7. Gastrointestinal:  Abdomen is soft, nondistended, nontender to palpation bowel sounds active, no masses or organomegaly palpated   8. Skin:  Skin is warm, dry and intact without rashes, acute lesions, or ulcers on limited exam   9.Musculoskeletal:  No acute deformities or trauma, no asymmetry in tone, no peripheral edema, peripheral pulses palpated, no tenderness to palpation in the extremities     Data Review:    CBC Recent Labs  Lab 02/18/21 1849  WBC 8.6  HGB 14.1  HCT 42.8  PLT 297  MCV 93.2  MCH 30.7  MCHC 32.9  RDW 14.6   ------------------------------------------------------------------------------------------------------------------  Results for orders placed or performed during the hospital encounter of 02/18/21 (from the past 48 hour(s))  Basic metabolic panel     Status: Abnormal   Collection Time: 02/18/21  6:49 PM  Result Value Ref Range   Sodium 138 135 - 145 mmol/L   Potassium 3.2 (L) 3.5 - 5.1 mmol/L   Chloride 105 98 - 111 mmol/L   CO2 24 22 - 32 mmol/L   Glucose, Bld 113 (H) 70 - 99 mg/dL    Comment: Glucose reference range applies only to samples taken after fasting for at least 8 hours.   BUN 13 6 - 20 mg/dL    Creatinine, Ser 3.240.79 0.44 - 1.00 mg/dL   Calcium 8.8 (L) 8.9 - 10.3 mg/dL   GFR, Estimated >40>60 >10>60 mL/min    Comment: (NOTE) Calculated using the CKD-EPI Creatinine Equation (2021)    Anion gap 9 5 - 15    Comment: Performed at [facility], [address] [place], [place] [address]  CBC     Status: None   Collection Time: 02/18/21  6:49 PM  Result Value Ref Range   WBC 8.6 4.0 - 10.5 K/uL   RBC 4.59 3.87 - 5.11 MIL/uL   Hemoglobin 14.1 12.0 - 15.0 g/dL   HCT 66.442.8 40.336.0 - 47.446.0 %   MCV 93.2 80.0 - 100.0 fL   MCH 30.7 26.0 - 34.0 pg   MCHC 32.9 30.0 - 36.0 g/dL   RDW 25.914.6 56.311.5 - 87.515.5 %   Platelets 297 150 - 400 K/uL   nRBC 0.0 0.0 - 0.2 %    Comment: Performed at [facility], [address] [place], [place] [address]  Troponin I (High Sensitivity)     Status: None   Collection Time: 02/18/21  6:49 PM  Result Value Ref Range   Troponin I (High Sensitivity) 4 <18 ng/L    Comment: (NOTE) Elevated high sensitivity troponin I (hsTnI) values and significant  changes across serial measurements may suggest ACS but many other  chronic and acute conditions are known to elevate hsTnI results.  Refer to the "Links" section for chest pain algorithms and additional  guidance. Performed at [facility], [address] [place], [place] [address]   TSH     Status: None   Collection Time: 02/18/21  6:49 PM  Result Value Ref Range   TSH 2.122 0.350 - 4.500 uIU/mL    Comment: Performed by a 3rd Generation assay with  a functional sensitivity of <=0.01 uIU/mL. Performed at [facility], [address] [place], [place] [address]   Magnesium     Status: None   Collection Time: 02/18/21  6:49 PM  Result Value Ref Range   Magnesium 1.9 1.7 - 2.4 mg/dL    Comment: Performed at [facility], [address], [place], [place] [address]  Troponin I (High Sensitivity)     Status: None   Collection Time: 02/18/21 10:18 PM  Result Value Ref Range   Troponin I (High Sensitivity) 4 <18 ng/L    Comment:  (NOTE) Elevated high sensitivity troponin I (hsTnI) values and significant  changes across serial measurements may suggest ACS but many other  chronic and acute conditions are known to elevate hsTnI results.  Refer to the "Links" section for chest pain algorithms and additional  guidance. Performed at [facility], [address] [place], [place] [address]   Hepatic function panel     Status: Abnormal   Collection Time: 02/18/21 10:18 PM  Result Value Ref Range   Total Protein 7.2 6.5 - 8.1 g/dL   Albumin 3.9 3.5 - 5.0 g/dL   AST 938 (H) 15 - 41 U/L   ALT 350 (H) 0 - 44 U/L   Alkaline Phosphatase 133 (H) 38 - 126 U/L   Total Bilirubin 1.0 0.3 - 1.2 mg/dL   Bilirubin, Direct 0.2 0.0 - 0.2 mg/dL   Indirect Bilirubin 0.8 0.3 - 0.9 mg/dL    Comment: Performed at [facility], [address] [place], [place] [address]  Lipase, blood     Status: None   Collection Time: 02/18/21 10:18 PM  Result Value Ref Range   Lipase 35 11 - 51 U/L    Comment: Performed at [facility], [address], [place], [place] [address]  Resp Panel by RT-PCR (Flu A&B, Covid) Nasopharyngeal Swab     Status: None   Collection Time: 02/19/21 12:16 AM   Specimen: Nasopharyngeal Swab; Nasopharyngeal(NP) swabs in vial transport medium  Result Value Ref Range   SARS Coronavirus 2 by RT PCR NEGATIVE NEGATIVE    Comment: (NOTE) SARS-CoV-2 target nucleic acids are NOT DETECTED.  The SARS-CoV-2 RNA is generally detectable in upper respiratory specimens during the acute phase of infection. The lowest concentration of SARS-CoV-2 viral copies this assay can detect is 138 copies/mL. A negative result does not preclude SARS-Cov-2 infection and should not be used as the sole basis for treatment or other patient management decisions. A negative result may occur with  improper specimen collection/handling, submission of specimen other than nasopharyngeal swab, presence of viral mutation(s) within the areas targeted by this  assay, and inadequate number of viral copies(<138 copies/mL). A negative result must be combined with clinical observations, patient history, and epidemiological information. The expected result is Negative.  Fact Sheet for Patients:  BloggerCourse.com  Fact Sheet for Healthcare Providers:  SeriousBroker.it  This test is no t yet approved or cleared by the [place] FDA and  has been authorized for detection and/or diagnosis of SARS-CoV-2 by FDA under an Emergency Use Authorization (EUA). This EUA will remain  in effect (meaning this test can be used) for the duration of the COVID-19 declaration under Section 564(b)(1) of the Act, 21 U.S.C.section 360bbb-3(b)(1), unless the authorization is terminated  or revoked sooner.       Influenza A by PCR NEGATIVE NEGATIVE   Influenza B by PCR NEGATIVE NEGATIVE    Comment: (NOTE) The Xpert Xpress SARS-CoV-2/FLU/RSV plus assay is  intended as an aid in the diagnosis of influenza from Nasopharyngeal swab specimens and should not be used as a sole basis for treatment. Nasal washings and aspirates are unacceptable for Xpert Xpress SARS-CoV-2/FLU/RSV testing.  Fact Sheet for Patients: BloggerCourse.com  Fact Sheet for Healthcare Providers: SeriousBroker.it  This test is not yet approved or cleared by the [place] FDA and has been authorized for detection and/or diagnosis of SARS-CoV-2 by FDA under an Emergency Use Authorization (EUA). This EUA will remain in effect (meaning this test can be used) for the duration of the COVID-19 declaration under Section 564(b)(1) of the Act, 21 U.S.C. section 360bbb-3(b)(1), unless the authorization is terminated or revoked.  Performed at [facility], [address] [place], [place] [address]     Chemistries  Recent Labs  Lab 02/18/21 1849 02/18/21 2218  NA 138  --   K 3.2*  --   CL 105   --   CO2 24  --   GLUCOSE 113*  --   BUN 13  --   CREATININE 0.79  --   CALCIUM 8.8*  --   MG 1.9  --   AST  --  177*  ALT  --  350*  ALKPHOS  --  133*  BILITOT  --  1.0   ------------------------------------------------------------------------------------------------------------------  ------------------------------------------------------------------------------------------------------------------ GFR: Estimated Creatinine Clearance: 88.1 mL/min (by C-G formula based on SCr of 0.79 mg/dL). Liver Function Tests: Recent Labs  Lab 02/18/21 2218  AST 177*  ALT 350*  ALKPHOS 133*  BILITOT 1.0  PROT 7.2  ALBUMIN 3.9   Recent Labs  Lab 02/18/21 2218  LIPASE 35   No results for input(s): AMMONIA in the last 168 hours. Coagulation Profile: No results for input(s): INR, PROTIME in the last 168 hours. Cardiac Enzymes: No results for input(s): CKTOTAL, CKMB, CKMBINDEX, TROPONINI in the last 168 hours. BNP (last 3 results) No results for input(s): PROBNP in the last 8760 hours. HbA1C: No results for input(s): HGBA1C in the last 72 hours. CBG: No results for input(s): GLUCAP in the last 168 hours. Lipid Profile: No results for input(s): CHOL, HDL, LDLCALC, TRIG, CHOLHDL, LDLDIRECT in the last 72 hours. Thyroid Function Tests: Recent Labs    02/18/21 1849  TSH 2.122   Anemia Panel: No results for input(s): VITAMINB12, FOLATE, FERRITIN, TIBC, IRON, RETICCTPCT in the last 72 hours.  --------------------------------------------------------------------------------------------------------------- Urine analysis:    Component Value Date/Time   COLORURINE AMBER (A) 08/30/2018 0035   APPEARANCEUR HAZY (A) 08/30/2018 0035   APPEARANCEUR Hazy 05/07/2014 0120   LABSPEC 1.036 (H) 08/30/2018 0035   LABSPEC 1.023 05/07/2014 0120   PHURINE 5.0 08/30/2018 0035   GLUCOSEU NEGATIVE 08/30/2018 0035   GLUCOSEU Negative 05/07/2014 0120   HGBUR NEGATIVE 08/30/2018 0035   BILIRUBINUR  NEGATIVE 08/30/2018 0035   BILIRUBINUR Negative 05/07/2014 0120   KETONESUR 5 (A) 08/30/2018 0035   PROTEINUR 30 (A) 08/30/2018 0035   NITRITE NEGATIVE 08/30/2018 0035   LEUKOCYTESUR MODERATE (A) 08/30/2018 0035   LEUKOCYTESUR Negative 05/07/2014 0120      Imaging Results:    CT ABDOMEN PELVIS W CONTRAST  Result Date: 02/18/2021 CLINICAL DATA:  Abdominal pain and nausea. EXAM: CT ABDOMEN AND PELVIS WITH CONTRAST TECHNIQUE: Multidetector CT imaging of the abdomen and pelvis was performed using the standard protocol following bolus administration of intravenous contrast. CONTRAST:  78mL OMNIPAQUE IOHEXOL 350 MG/ML SOLN COMPARISON:  None. FINDINGS: Lower chest: Small right and tiny left pleural effusions. Hepatobiliary: No suspicious focal abnormality within the liver  parenchyma. Gallbladder wall thickening and/or trace pericholecystic fluid noted. No calcified gallstones evident by CT. No intrahepatic or extrahepatic biliary dilation. Pancreas: No focal mass lesion. No dilatation of the main duct. No intraparenchymal cyst. No peripancreatic edema. Spleen: Wedge-shaped subcapsular focus of hypoperfusion in the inferior spleen (well seen coronal image 65/series 5) is compatible with infarct. Adrenals/Urinary Tract: No adrenal nodule or mass. 11 mm probable cyst noted interpolar right kidney with no suspicious enhancing abnormality evident in either kidney. No evidence for hydroureter. The urinary bladder appears normal for the degree of distention. Stomach/Bowel: Stomach is nondistended. Duodenum is normally positioned as is the ligament of Treitz. No small bowel wall thickening. No small bowel dilatation. The terminal ileum is normal. The appendix is normal. No gross colonic mass. No colonic wall thickening. No substantial diverticular disease evident. No findings of diverticulitis. Vascular/Lymphatic: No abdominal aortic aneurysm. No abdominal aortic atherosclerotic calcification. There is no  gastrohepatic or hepatoduodenal ligament lymphadenopathy. No retroperitoneal or mesenteric lymphadenopathy. No pelvic sidewall lymphadenopathy. Reproductive: The uterus is unremarkable.  There is no adnexal mass. Other: There is some trace fluid in the posterior pelvis, indeterminate. Musculoskeletal: No worrisome lytic or sclerotic osseous abnormality. Degenerative changes are noted in the hips and at the symphysis pubis. IMPRESSION: 1. Small, wedge-shaped subcapsular focus of hypoperfusion in the inferior spleen compatible with infarct. 2. Gallbladder wall thickening and/or trace pericholecystic fluid. No calcified gallstones evident by CT. Right upper quadrant ultrasound suggested to further evaluate. 3. Small right and tiny left pleural effusions. 4. Trace fluid in the posterior pelvis, indeterminate. Electronically Signed   By: Kennith Center M.D.   On: 02/18/2021 22:03   DG Chest Port 1 View  Result Date: 02/18/2021 CLINICAL DATA:  Shortness of breath EXAM: PORTABLE CHEST 1 VIEW COMPARISON:  04/10/2019 FINDINGS: Cardiac shadow is mildly prominent but accentuated by the portable technique. The lungs are well aerated bilaterally. Mild interstitial changes are seen felt to be of a chronic nature given their long-term stability. No focal infiltrate is noted. No sizable effusion is seen. No bony abnormality is noted. IMPRESSION: No acute abnormality noted. Electronically Signed   By: Alcide Clever M.D.   On: 02/18/2021 19:29    My personal review of EKG: Rhythm atrial fibrillation, Rate 149 /min, QTc 514 ,no Acute ST changes   Assessment & Plan:    Active Problems:   Atrial fibrillation with RVR (HCC)   Atrial fibrillation with RVR Continue Eliquis As needed metoprolol Echo for new onset A. Fib Chest x-ray showed no acute abnormality Troponin 4, 4 TSH 2.122 Continue to monitor Abdominal pain CT scan shows possible gallbladder wall thickening Ultrasound right upper quadrant N.p.o. except for  meds Pain control with Oxy IR and morphine Continue to monitor Hypertensive crisis Continue home losartan Unclear reasons why hydrochlorothiazide was discontinued Start amlodipine 1 dose of IV Cardizem given for blood pressure IV metoprolol also given in the ED Systolic blood pressure is improved from 200-1 70 Continue to monitor History of PE Continue Eliquis Splenic infarct Patient had some missed doses of Eliquis, continue Eliquis Hypokalemia Replace and recheck    DVT Prophylaxis-   Eliquis- SCDs   AM Labs Ordered, also please review Full Orders  Family Communication: No family at bedside  Code Status: Full  Admission status: Observation Time spent in minutes : 65   Yaneisy Wenz B Zierle-Ghosh DO

## 2021-02-19 NOTE — Progress Notes (Signed)
  Echocardiogram 2D Echocardiogram has been performed.  Carolyne Fiscal 02/19/2021, 1:17 PM

## 2021-02-19 NOTE — ED Notes (Signed)
Pt had episode of vomiting, zofran given

## 2021-02-20 ENCOUNTER — Encounter (HOSPITAL_COMMUNITY): Payer: Self-pay | Admitting: Family Medicine

## 2021-02-20 DIAGNOSIS — Z8701 Personal history of pneumonia (recurrent): Secondary | ICD-10-CM | POA: Diagnosis not present

## 2021-02-20 DIAGNOSIS — Z86718 Personal history of other venous thrombosis and embolism: Secondary | ICD-10-CM | POA: Diagnosis not present

## 2021-02-20 DIAGNOSIS — I5041 Acute combined systolic (congestive) and diastolic (congestive) heart failure: Secondary | ICD-10-CM | POA: Diagnosis not present

## 2021-02-20 DIAGNOSIS — I77810 Thoracic aortic ectasia: Secondary | ICD-10-CM | POA: Diagnosis not present

## 2021-02-20 DIAGNOSIS — Z86711 Personal history of pulmonary embolism: Secondary | ICD-10-CM | POA: Diagnosis not present

## 2021-02-20 DIAGNOSIS — I4891 Unspecified atrial fibrillation: Secondary | ICD-10-CM | POA: Diagnosis not present

## 2021-02-20 DIAGNOSIS — Z8616 Personal history of COVID-19: Secondary | ICD-10-CM | POA: Diagnosis not present

## 2021-02-20 DIAGNOSIS — Z20822 Contact with and (suspected) exposure to covid-19: Secondary | ICD-10-CM | POA: Diagnosis not present

## 2021-02-20 DIAGNOSIS — Z7952 Long term (current) use of systemic steroids: Secondary | ICD-10-CM | POA: Diagnosis not present

## 2021-02-20 DIAGNOSIS — I2699 Other pulmonary embolism without acute cor pulmonale: Secondary | ICD-10-CM | POA: Diagnosis not present

## 2021-02-20 DIAGNOSIS — I255 Ischemic cardiomyopathy: Secondary | ICD-10-CM | POA: Diagnosis present

## 2021-02-20 DIAGNOSIS — E876 Hypokalemia: Secondary | ICD-10-CM | POA: Diagnosis not present

## 2021-02-20 DIAGNOSIS — Z7901 Long term (current) use of anticoagulants: Secondary | ICD-10-CM | POA: Diagnosis not present

## 2021-02-20 DIAGNOSIS — Z8249 Family history of ischemic heart disease and other diseases of the circulatory system: Secondary | ICD-10-CM | POA: Diagnosis not present

## 2021-02-20 DIAGNOSIS — I480 Paroxysmal atrial fibrillation: Secondary | ICD-10-CM | POA: Diagnosis not present

## 2021-02-20 DIAGNOSIS — R0602 Shortness of breath: Secondary | ICD-10-CM | POA: Diagnosis present

## 2021-02-20 DIAGNOSIS — I110 Hypertensive heart disease with heart failure: Secondary | ICD-10-CM | POA: Diagnosis not present

## 2021-02-20 DIAGNOSIS — R140 Abdominal distension (gaseous): Secondary | ICD-10-CM | POA: Diagnosis present

## 2021-02-20 DIAGNOSIS — R748 Abnormal levels of other serum enzymes: Secondary | ICD-10-CM | POA: Diagnosis present

## 2021-02-20 DIAGNOSIS — I447 Left bundle-branch block, unspecified: Secondary | ICD-10-CM | POA: Diagnosis present

## 2021-02-20 DIAGNOSIS — D735 Infarction of spleen: Secondary | ICD-10-CM | POA: Diagnosis present

## 2021-02-20 DIAGNOSIS — I5043 Acute on chronic combined systolic (congestive) and diastolic (congestive) heart failure: Secondary | ICD-10-CM | POA: Diagnosis not present

## 2021-02-20 DIAGNOSIS — Z28310 Unvaccinated for covid-19: Secondary | ICD-10-CM | POA: Diagnosis not present

## 2021-02-20 DIAGNOSIS — I160 Hypertensive urgency: Secondary | ICD-10-CM | POA: Diagnosis present

## 2021-02-20 DIAGNOSIS — Z79899 Other long term (current) drug therapy: Secondary | ICD-10-CM | POA: Diagnosis not present

## 2021-02-20 LAB — COMPREHENSIVE METABOLIC PANEL
ALT: 277 U/L — ABNORMAL HIGH (ref 0–44)
AST: 147 U/L — ABNORMAL HIGH (ref 15–41)
Albumin: 3.3 g/dL — ABNORMAL LOW (ref 3.5–5.0)
Alkaline Phosphatase: 106 U/L (ref 38–126)
Anion gap: 6 (ref 5–15)
BUN: 14 mg/dL (ref 6–20)
CO2: 27 mmol/L (ref 22–32)
Calcium: 8.6 mg/dL — ABNORMAL LOW (ref 8.9–10.3)
Chloride: 105 mmol/L (ref 98–111)
Creatinine, Ser: 0.88 mg/dL (ref 0.44–1.00)
GFR, Estimated: >60 mL/min (ref >60)
Glucose, Bld: 101 mg/dL — ABNORMAL HIGH (ref 70–99)
Potassium: 3.4 mmol/L — ABNORMAL LOW (ref 3.5–5.1)
Sodium: 138 mmol/L (ref 135–145)
Total Bilirubin: 0.8 mg/dL (ref 0.3–1.2)
Total Protein: 5.9 g/dL — ABNORMAL LOW (ref 6.5–8.1)

## 2021-02-20 LAB — CBC
HCT: 39 % (ref 36.0–46.0)
Hemoglobin: 12.4 g/dL (ref 12.0–15.0)
MCH: 29.9 pg (ref 26.0–34.0)
MCHC: 31.8 g/dL (ref 30.0–36.0)
MCV: 94 fL (ref 80.0–100.0)
Platelets: 260 10*3/uL (ref 150–400)
RBC: 4.15 MIL/uL (ref 3.87–5.11)
RDW: 14.6 % (ref 11.5–15.5)
WBC: 7.1 10*3/uL (ref 4.0–10.5)
nRBC: 0 % (ref 0.0–0.2)

## 2021-02-20 LAB — MAGNESIUM: Magnesium: 2 mg/dL (ref 1.7–2.4)

## 2021-02-20 MED ORDER — POTASSIUM CHLORIDE CRYS ER 20 MEQ PO TBCR
40.0000 meq | EXTENDED_RELEASE_TABLET | Freq: Once | ORAL | Status: AC
  Administered 2021-02-20: 40 meq via ORAL
  Filled 2021-02-20: qty 2

## 2021-02-20 MED ORDER — POLYETHYLENE GLYCOL 3350 17 G PO PACK
17.0000 g | PACK | Freq: Every day | ORAL | Status: DC | PRN
Start: 2021-02-20 — End: 2021-02-22

## 2021-02-20 MED ORDER — SENNA 8.6 MG PO TABS
1.0000 | ORAL_TABLET | Freq: Every day | ORAL | Status: DC | PRN
  Administered 2021-02-20: 8.6 mg via ORAL
  Filled 2021-02-20: qty 1

## 2021-02-20 MED ORDER — FUROSEMIDE 10 MG/ML IJ SOLN
40.0000 mg | Freq: Once | INTRAMUSCULAR | Status: AC
  Administered 2021-02-20: 40 mg via INTRAVENOUS
  Filled 2021-02-20: qty 4

## 2021-02-20 MED ORDER — SACUBITRIL-VALSARTAN 24-26 MG PO TABS
1.0000 | ORAL_TABLET | Freq: Two times a day (BID) | ORAL | Status: DC
  Administered 2021-02-21 – 2021-02-22 (×3): 1 via ORAL
  Filled 2021-02-20 (×3): qty 1

## 2021-02-20 MED ORDER — METOPROLOL SUCCINATE ER 25 MG PO TB24
25.0000 mg | ORAL_TABLET | Freq: Every day | ORAL | Status: DC
  Administered 2021-02-20 – 2021-02-21 (×2): 25 mg via ORAL
  Filled 2021-02-20 (×2): qty 1

## 2021-02-20 NOTE — Consult Note (Signed)
Cardiology Consultation:   Patient ID: Jo Knapp MRN: [id number]; DOB: [birth year]  Admit date: 02/18/2021 Date of Consult: 02/20/2021  PCP:  Jo Knapp, [place] Providers Cardiologist: Previously followed by [facility] in 2017 - No recent outpatient follow-up  Patient Profile:   Jo Knapp is a 60 y.o. female with a past medical history of prior PE (diagnosed in 04/2020 in the setting of COVID-19 PNA) and HTN who is being seen today for the evaluation of new cardiomyopathy at the request of Dr. Maylene Roes.  History of Present Illness:   Jo Knapp presented to [facility] ED on 02/18/2021 for evaluation of dyspnea, nausea and vomiting for the past few days. In talking with the patient today, she reports having intermittent palpitations for the past 6-8 weeks. Initially thought it might be secondary to stress at her job but she switched jobs and continued to have intermittent symptoms. This week, she started to experience orthopnea and PND and would wake up at night. No associated dyspnea on exertion or chest pain. Has noticed some abdominal distension. She is unaware of any prior history of CAD or CHF. She is adopted and is unaware of family history except she knows her biological parents passed away before age 57. She denies any tobacco use or recreational drug use. Does consume an occasional glass of wine or mixed drink but no binge drinking. Does consume fast food regularly and also consumes several caffeinated sodas per day. Says she was on Eliquis until 1-2 months ago when she ran out of the medication but previously tolerated well.   Upon arrival to the ED, she was found to be in atrial fibrillation with RVR, HR in the 150's at times. Was also in hypertensive urgency with BP at 200/112. Initial labs show WBC 8.6, Hgb 14.1, platelets 297, Na+ 138, K+ 3.2 and creatinine 0.79. AST 177 and ALT 350. Alk Phos 133. Lipase normal. TSH 2.122. COVID negative. CXR with no  acute findings. CT Abdomen showing infarcts along the inferior spleen and gallbladder wall thickening along with small pleural effusions. Abdominal US showed gallbladder wall thickening with no gallstones or evidence of acute cholecystitis. EKG shows a narrow complex tachycardia, HR 150 and most consistent with atrial fibrillation with RVR but cannot rule-out flutter. She did convert back to NSR while in the ED. Has been in NSR by review of telemetry but continues to experience frequent ectopy with couplets and episodes of ventricular bigeminy.   Echo shows a reduced EF of 25-30% with diffuse HK, worse in the inferior and inferoseptal walls. Also noted to have mild LVH and Grade 2 DD and moderately reduced RV function. PASP mildly elevated, moderate biatrial dilation, mild MR, trivial AI and mild to moderate AV sclerosis without stenosis.     Past Medical History:  Diagnosis Date   Hypertension     Past Surgical History:  Procedure Laterality Date   ANKLE SURGERY     CESAREAN SECTION WITH BILATERAL TUBAL LIGATION       Home Medications:  Prior to Admission medications   Medication Sig Start Date End Date Taking? Authorizing Provider  apixaban (ELIQUIS) 5 MG TABS tablet Take 1 tablet (5 mg total) by mouth 2 (two) times daily. Start around 05/12/20 after completing initial started pack 05/12/20  Yes Emokpae, Courage, MD  losartan (COZAAR) 50 MG tablet Take 50 mg by mouth daily.   Yes [provider]  Apixaban Starter Pack, 56m and 562m (ELIQUIS DVT/PE STARTER PACK) Take  as directed on package: start with two-61m tablets twice daily for 7 days. On day 8, switch to one-56mtablet twice daily. Patient not taking: No sig reported 04/12/20   EmRoxan HockeyMD  ascorbic acid (VITAMIN C) 500 MG tablet Take 1 tablet (500 mg total) by mouth daily. Patient not taking: No sig reported 04/13/20   EmRoxan HockeyMD  hydrochlorothiazide (HYDRODIURIL) 25 MG tablet Take 0.5 tablets (12.5 mg  total) by mouth daily. For BP Patient not taking: No sig reported 04/12/20   EmRoxan HockeyMD  omeprazole (PRILOSEC) 20 MG capsule Take 1 capsule (20 mg total) by mouth daily. Patient not taking: No sig reported 04/12/20 04/12/21  EmRoxan HockeyMD  Potassium Chloride ER 20 MEQ TBCR Take 20 mEq by mouth every Monday, Wednesday, and Friday. While taking HCTZ Patient not taking: No sig reported 04/12/20   EmRoxan HockeyMD  predniSONE (DELTASONE) 20 MG tablet Take 2 tablets (40 mg total) by mouth daily with breakfast. Patient not taking: No sig reported 04/12/20   EmRoxan HockeyMD  zinc sulfate 220 (50 Zn) MG capsule Take 1 capsule (220 mg total) by mouth daily. Patient not taking: No sig reported 04/13/20   EmRoxan HockeyMD    Inpatient Medications: Scheduled Meds:  apixaban  5 mg Oral BID   furosemide  40 mg Intravenous Once   losartan  50 mg Oral Daily   metoprolol succinate  25 mg Oral Daily   Continuous Infusions:  PRN Meds: acetaminophen **OR** acetaminophen, metoprolol tartrate, morphine injection, ondansetron **OR** ondansetron (ZOFRAN) IV, oxyCODONE  Allergies:   No Known Allergies  Social History:   Social History   Socioeconomic History   Marital status: Single    Spouse name: Not on file   Number of children: Not on file   Years of education: Not on file   Highest education level: Not on file  Occupational History   Not on file  Tobacco Use   Smoking status: Never   Smokeless tobacco: Never  Substance and Sexual Activity   Alcohol use: Yes   Drug use: Not Currently   Sexual activity: Not on file  Other Topics Concern   Not on file  Social History Narrative   Not on file   Social Determinants of Health   Financial Resource Strain: Not on file  Food Insecurity: Not on file  Transportation Needs: Not on file  Physical Activity: Not on file  Stress: Not on file  Social Connections: Not on file  Intimate Partner Violence: Not on file     Family History:    Family History  Adopted: Yes  Problem Relation Age of Onset   Heart disease Mother      ROS:  Please see the history of present illness.   All other ROS reviewed and negative.     Physical Exam/Data:   Vitals:   02/19/21 1834 02/19/21 2042 02/20/21 0455 02/20/21 0833  BP: (!) 130/92 (!) 149/106 (!) 156/113 (!) 154/130  Pulse: 79 83 82   Resp: _0 Temp: 97.8 F (36.6 C) 98.3 F (36.8 C) 97.9 F (36.6 C)   TempSrc: Oral Oral Oral   SpO2: 97% 96% 93%   Weight:      Height:        Intake/Output Summary (Last 24 hours) at 02/20/2021 1006 Last data filed at 02/20/2021 0904 Gross per 24 hour  Intake 2730 ml  Output --  Net 2730 ml   Last 3 Weights  02/18/2021 04/10/2020 04/08/2020  Weight (lbs) 210 lb 205 lb 0.4 oz 210 lb  Weight (kg) 95.255 kg 93 kg 95.255 kg     Body mass index is 33.89 kg/m.  General:  Well nourished, well developed female appearing in no acute distress HEENT: normal Lymph: no adenopathy Neck: JVD at 8 cm.  Endocrine:  No thryomegaly Vascular: No carotid bruits; FA pulses 2+ bilaterally without bruits  Cardiac:  normal S1, S2; RRR with occasional ectopic beats.  Lungs:  clear to auscultation bilaterally, no wheezing, rhonchi or rales  Abd: soft, nontender, no hepatomegaly  Ext: Trace lower extremity edema.  Musculoskeletal:  No deformities, BUE and BLE strength normal and equal Skin: warm and dry  Neuro:  CNs 2-12 intact, no focal abnormalities noted Psych:  Normal affect   EKG:  The EKG was personally reviewed and demonstrates: Narrow complex tachycardia, HR 150 and most consistent with atrial fibrillation with RVR but cannot rule-out flutter.  Telemetry:  Telemetry was personally reviewed and demonstrates:  NSR, HR in 70's to 80's with frequent PVC's sometimes as couplets and episodes of bigeminy.   Relevant CV Studies:  Echocardiogram: 09/2015  Normal left ventricular systolic function, ejection fraction 55 to 60%    Left ventricular hypertrophy - moderate   Diastolic dysfunction - grade I (normal filling pressures)   Normal right ventricular systolic function  Echocardiogram: 02/19/2021 IMPRESSIONS     1. Diffuse hypokinesis, worse in the inferior and inferoseptal walls. .  Left ventricular ejection fraction, by estimation, is 25 to 30%. The left  ventricle has severely decreased function. The left ventricular internal  cavity size was mildly dilated.  There is mild left ventricular hypertrophy. Left ventricular diastolic  parameters are consistent with Grade II diastolic dysfunction  (pseudonormalization).   2. Right ventricular systolic function is moderately reduced. The right  ventricular size is normal. There is mildly elevated pulmonary artery  systolic pressure.   3. Left atrial size was mild to moderately dilated.   4. Right atrial size was mild to moderately dilated.   5. The mitral valve is normal in structure. Mild mitral valve  regurgitation.   6. The aortic valve is tricuspid. Aortic valve regurgitation is trivial.  Mild to moderate aortic valve sclerosis/calcification is present, without  any evidence of aortic stenosis.   7. There is mild dilatation of the ascending aorta, measuring 40 mm.   8. The inferior vena cava is dilated in size with <50% respiratory  variability, suggesting right atrial pressure of 15 mmHg.   Laboratory Data:  High Sensitivity Troponin:   Recent Labs  Lab 02/18/21 1849 02/18/21 2218  TROPONINIHS 4 4     Chemistry Recent Labs  Lab 02/18/21 1849 02/19/21 0530 02/20/21 0408  NA 138 137 138  K 3.2* 3.4* 3.4*  CL 105 105 105  CO2 _0 GLUCOSE 113* 133* 101*  BUN _1 CREATININE 0.79 0.84 0.88  CALCIUM 8.8* 8.1* 8.6*  GFRNONAA >60 >60 >60  ANIONGAP _2 Recent Labs  Lab 02/18/21 2218 02/19/21 0530 02/20/21 0408  PROT 7.2 7.0 5.9*  ALBUMIN 3.9 3.7 3.3*  AST 177* 153* 147*  ALT 350* 308* 277*  ALKPHOS 133* 121 106   BILITOT 1.0 1.0 0.8   Hematology Recent Labs  Lab 02/18/21 1849 02/19/21 0530 02/20/21 0408  WBC 8.6 8.4 7.1  RBC 4.59 4.42 4.15  HGB 14.1 13.5 12.4  HCT 42.8 41.3 39.0  MCV 93.2 93.4  94.0  MCH 30.7 30.5 29.9  MCHC 32.9 32.7 31.8  RDW 14.6 14.6 14.6  PLT 297 280 260   BNPNo results for input(s): BNP, PROBNP in the last 168 hours.  DDimer No results for input(s): DDIMER in the last 168 hours.   Radiology/Studies:  CT ABDOMEN PELVIS W CONTRAST  Result Date: 02/18/2021 CLINICAL DATA:  Abdominal pain and nausea. EXAM: CT ABDOMEN AND PELVIS WITH CONTRAST TECHNIQUE: Multidetector CT imaging of the abdomen and pelvis was performed using the standard protocol following bolus administration of intravenous contrast. CONTRAST:  21m OMNIPAQUE IOHEXOL 350 MG/ML SOLN COMPARISON:  None. FINDINGS: Lower chest: Small right and tiny left pleural effusions. Hepatobiliary: No suspicious focal abnormality within the liver parenchyma. Gallbladder wall thickening and/or trace pericholecystic fluid noted. No calcified gallstones evident by CT. No intrahepatic or extrahepatic biliary dilation. Pancreas: No focal mass lesion. No dilatation of the main duct. No intraparenchymal cyst. No peripancreatic edema. Spleen: Wedge-shaped subcapsular focus of hypoperfusion in the inferior spleen (well seen coronal image 65/series 5) is compatible with infarct. Adrenals/Urinary Tract: No adrenal nodule or mass. 11 mm probable cyst noted interpolar right kidney with no suspicious enhancing abnormality evident in either kidney. No evidence for hydroureter. The urinary bladder appears normal for the degree of distention. Stomach/Bowel: Stomach is nondistended. Duodenum is normally positioned as is the ligament of Treitz. No small bowel wall thickening. No small bowel dilatation. The terminal ileum is normal. The appendix is normal. No gross colonic mass. No colonic wall thickening. No substantial diverticular disease evident. No  findings of diverticulitis. Vascular/Lymphatic: No abdominal aortic aneurysm. No abdominal aortic atherosclerotic calcification. There is no gastrohepatic or hepatoduodenal ligament lymphadenopathy. No retroperitoneal or mesenteric lymphadenopathy. No pelvic sidewall lymphadenopathy. Reproductive: The uterus is unremarkable.  There is no adnexal mass. Other: There is some trace fluid in the posterior pelvis, indeterminate. Musculoskeletal: No worrisome lytic or sclerotic osseous abnormality. Degenerative changes are noted in the hips and at the symphysis pubis. IMPRESSION: 1. Small, wedge-shaped subcapsular focus of hypoperfusion in the inferior spleen compatible with infarct. 2. Gallbladder wall thickening and/or trace pericholecystic fluid. No calcified gallstones evident by CT. Right upper quadrant ultrasound suggested to further evaluate. 3. Small right and tiny left pleural effusions. 4. Trace fluid in the posterior pelvis, indeterminate. Electronically Signed   By: EMisty StanleyM.D.   On: 02/18/2021 22:03   DG Chest Port 1 View  Result Date: 02/18/2021 CLINICAL DATA:  Shortness of breath EXAM: PORTABLE CHEST 1 VIEW COMPARISON:  04/10/2019 FINDINGS: Cardiac shadow is mildly prominent but accentuated by the portable technique. The lungs are well aerated bilaterally. Mild interstitial changes are seen felt to be of a chronic nature given their long-term stability. No focal infiltrate is noted. No sizable effusion is seen. No bony abnormality is noted. IMPRESSION: No acute abnormality noted. Electronically Signed   By: MInez CatalinaM.D.   On: 02/18/2021 19:29    [place]Abdomen Limited RUQ (LIVER/GB)  Result Date: 02/19/2021 CLINICAL DATA:  Abnormal CT with gallbladder wall thickening/fluid. Abdominal pain. EXAM: ULTRASOUND ABDOMEN LIMITED RIGHT UPPER QUADRANT COMPARISON:  CT 1 day prior FINDINGS: Gallbladder: No gallstones. Mild gallbladder wall thickening at up to 5 mm. Sonographic Murphy's sign was not  elicited. No pericholecystic fluid. Common bile duct: Diameter: Normal, 3 mm. Liver: No focal lesion identified. Within normal limits in parenchymal echogenicity. Portal vein is patent on color Doppler imaging with normal direction of blood flow towards the liver. Other: Small right pleural effusion, as on CT.  IMPRESSION: 1. Mild nonspecific gallbladder wall thickening. No gallstones or specific evidence of acute cholecystitis 2. Right pleural effusion. Electronically Signed   By: Abigail Miyamoto M.D.   On: 02/19/2021 11:17     Assessment and Plan:   1. HFrEF - Echo this admission shows a reduced EF of 25-30% in the setting of newly diagnosed atrial fibrillation with RVR. Expect a tachycardia-mediated cardiomyopathy and would anticipate a repeat echocardiogram in 3 months for reassessment of her EF. If still reduced, would plan for ischemic evaluation at that time.  - She is currently on Amlodipine, Losartan and intermittent doses of IV Lopressor. Will stop Amlodipine. Start Toprol-XL 61m daily and titrate as needed. Pending BP response, would consider switching Losartan to [employer] Can also consider adding Spironolactone and SGLT2 inhibitor therapy as an outpatient to optimize guideline directed medical therapy.  - She does report some orthopnea overnight so will write for IV Lasix [id number]. Follow I&O's along with daily weighs. Will reassess tomorrow for additional diuresis. We reviewed the importance of limiting her sodium and fluid intake.   2. Atrial Fibrillation with RVR - New diagnosis for the patient this admission and she converted back to NSR with IV Lopressor while in the ED. Will start Toprol-XL 2523maily which can be titrated as needed.  - Given her CHA2DS2-VASc Score of 5, she has been restarted on Eliquis 5mg35mD for anticoagulation.   3. Splenic Infarct - CT Abdomen this admission showed infarcts along the inferior spleen and gallbladder wall thickening along with small pleural  effusions. She has been restarted on Eliquis as outlined above.   4. Hypokalemia - K+ at 3.4 this admission and additional replacement has already been ordered. Try to keep K+ ~ 4.0 given her frequent ectopy. Mg at 2.0 this AM.    Risk Assessment/Risk Scores:   This patients CHA2DS2-VASc Score and unadjusted Ischemic Stroke Rate (% per year) is equal to 7.2 % stroke rate/year from a score of 5 (CHF, HTN, Female, Prior PE (2)).  Above score calculated as 1 point each if present [CHF, HTN, DM, Vascular=MI/PAD/Aortic Plaque, Age if 65-74, or Female] Above score calculated as 2 points each if present [Age > 75, or Stroke/TIA/TE]    For questions or updates, please contact [email or web address] consult [email or web address] for contact info under    Signed, BritErma Heritage-C  02/20/2021 10:06 AM

## 2021-02-20 NOTE — Progress Notes (Signed)
PROGRESS NOTE    Jo Knapp  UJW:[id number] DOB: 1961/06/30 DOA: 02/18/2021 PCP: Oneal Grout, FNP     Brief Narrative:  Jo Knapp  is a 60 y.o. female, with history of hypertension, and PE, presents the ED with a chief complaint of dyspnea.  Patient reports that she has had dyspnea, nausea, vomiting, lower abdominal pain, and dizziness.  CT abdomen shows small wedge-shaped subscapular focus hypoperfusion in the inferior spleen compatible with infarct.  Metoprolol given in the ED to abort A. fib RVR.  New events last 24 hours / Subjective: States that she does feel some fluttering in her chest, no chest pain or pressure.  Has some dyspnea with minimal exertion.  At the beginning of my examination, patient was in A. fib RVR with heart rate in the 130s. After exiting her room, she was back into normal sinus rhythm with heart rate in the 80s with some PVCs on telemetry.  Assessment & Plan:   Active Problems:   Atrial fibrillation with RVR (HCC)   A Fib RVR, new onset A Fib -CHA2DS2-VASc score 3 -Patient is on Eliquis for previous PE, continue  Acute combined systolic and diastolic heart failure -EF significantly decreased from previous echocardiogram available from [facility] in 2017.  EF 25-30%, grade 2 diastolic dysfunction, diffuse hypokinesis -Consult cardiology  Splenic infarct -Continue Eliquis  Elevated liver enzymes -Right upper quadrant ultrasound with nonspecific gallbladder wall thickening, without gallstones or evidence of acute cholecystitis -Improving, trend  History of PE -Continue Eliquis  Hypokalemia -Replace, trend  HTN -Continue cozaar, norvasc    DVT prophylaxis:  SCDs Start: 02/19/21 0343 apixaban (ELIQUIS) tablet 5 mg  Code Status:     Code Status Orders  (From admission, onward)           Start     Ordered   02/19/21 0343  Full code  Continuous        02/19/21 0342           Code Status History     Date Active Date  Inactive Code Status Order ID Comments User Context   04/09/2020 0823 04/12/2020 2340 Full Code [id number]  Erick Blinks, DO ED      Family Communication: No family at bedside Disposition Plan:  Status is: Observation  The patient will require care spanning > 2 midnights and should be moved to inpatient because: Ongoing diagnostic testing needed not appropriate for outpatient work up  Dispo: The patient is from: Home              Anticipated d/c is to: Home              Patient currently is not medically stable to d/c.   Difficult to place patient No      Consultants:  Cardiology  Procedures:  None  Antimicrobials:  Anti-infectives (From admission, onward)    None        Objective: Vitals:   02/19/21 1834 02/19/21 2042 02/20/21 0455 02/20/21 0833  BP: (!) 130/92 (!) 149/106 (!) 156/113 (!) 154/130  Pulse: 79 83 82   Resp: 20 18 18    Temp: 97.8 F (36.6 C) 98.3 F (36.8 C) 97.9 F (36.6 C)   TempSrc: Oral Oral Oral   SpO2: 97% 96% 93%   Weight:      Height:        Intake/Output Summary (Last 24 hours) at 02/20/2021 0844 Last data filed at 02/20/2021 0500 Gross per 24 hour  Intake 3450 ml  Output --  Net 3450 ml   Filed Weights   02/18/21 1829  Weight: 95.3 kg    Examination:  General exam: Appears calm and comfortable  Respiratory system: Clear to auscultation. Respiratory effort normal. No respiratory distress. No conversational dyspnea.  On room air Cardiovascular system: S1 & S2 heard, irregular rhythm with heart rate up to 130. No murmurs. No pedal edema. Gastrointestinal system: Abdomen is nondistended, soft and nontender. Normal bowel sounds heard. Central nervous system: Alert and oriented. No focal neurological deficits. Speech clear.  Extremities: Symmetric in appearance  Skin: No rashes, lesions or ulcers on exposed skin  Psychiatry: Judgement and insight appear normal. Mood & affect appropriate.   Data Reviewed: I have personally  reviewed following labs and imaging studies  CBC: Recent Labs  Lab 02/18/21 1849 02/19/21 0530 02/20/21 0408  WBC 8.6 8.4 7.1  HGB 14.1 13.5 12.4  HCT 42.8 41.3 39.0  MCV 93.2 93.4 94.0  PLT 297 280 260   Basic Metabolic Panel: Recent Labs  Lab 02/18/21 1849 02/19/21 0530 02/20/21 0408  NA 138 137 138  K 3.2* 3.4* 3.4*  CL 105 105 105  CO2 24 27 27   GLUCOSE 113* 133* 101*  BUN 13 13 14   CREATININE 0.79 0.84 0.88  CALCIUM 8.8* 8.1* 8.6*  MG 1.9 1.9 2.0   GFR: Estimated Creatinine Clearance: 80.1 mL/min (by C-G formula based on SCr of 0.88 mg/dL). Liver Function Tests: Recent Labs  Lab 02/18/21 2218 02/19/21 0530 02/20/21 0408  AST 177* 153* 147*  ALT 350* 308* 277*  ALKPHOS 133* 121 106  BILITOT 1.0 1.0 0.8  PROT 7.2 7.0 5.9*  ALBUMIN 3.9 3.7 3.3*   Recent Labs  Lab 02/18/21 2218  LIPASE 35   No results for input(s): AMMONIA in the last 168 hours. Coagulation Profile: No results for input(s): INR, PROTIME in the last 168 hours. Cardiac Enzymes: No results for input(s): CKTOTAL, CKMB, CKMBINDEX, TROPONINI in the last 168 hours. BNP (last 3 results) No results for input(s): PROBNP in the last 8760 hours. HbA1C: No results for input(s): HGBA1C in the last 72 hours. CBG: No results for input(s): GLUCAP in the last 168 hours. Lipid Profile: No results for input(s): CHOL, HDL, LDLCALC, TRIG, CHOLHDL, LDLDIRECT in the last 72 hours. Thyroid Function Tests: Recent Labs    02/18/21 1849  TSH 2.122   Anemia Panel: No results for input(s): VITAMINB12, FOLATE, FERRITIN, TIBC, IRON, RETICCTPCT in the last 72 hours. Sepsis Labs: No results for input(s): PROCALCITON, LATICACIDVEN in the last 168 hours.  Recent Results (from the past 240 hour(s))  Resp Panel by RT-PCR (Flu A&B, Covid) Nasopharyngeal Swab     Status: None   Collection Time: 02/19/21 12:16 AM   Specimen: Nasopharyngeal Swab; Nasopharyngeal(NP) swabs in vial transport medium  Result Value Ref  Range Status   SARS Coronavirus 2 by RT PCR NEGATIVE NEGATIVE Final    Comment: (NOTE) SARS-CoV-2 target nucleic acids are NOT DETECTED.  The SARS-CoV-2 RNA is generally detectable in upper respiratory specimens during the acute phase of infection. The lowest concentration of SARS-CoV-2 viral copies this assay can detect is 138 copies/mL. A negative result does not preclude SARS-Cov-2 infection and should not be used as the sole basis for treatment or other patient management decisions. A negative result may occur with  improper specimen collection/handling, submission of specimen other than nasopharyngeal swab, presence of viral mutation(s) within the areas targeted by this assay, and inadequate number of viral copies(<138 copies/mL). A  negative result must be combined with clinical observations, patient history, and epidemiological information. The expected result is Negative.  Fact Sheet for Patients:  BloggerCourse.com  Fact Sheet for Healthcare Providers:  SeriousBroker.it  This test is no t yet approved or cleared by the [place] FDA and  has been authorized for detection and/or diagnosis of SARS-CoV-2 by FDA under an Emergency Use Authorization (EUA). This EUA will remain  in effect (meaning this test can be used) for the duration of the COVID-19 declaration under Section 564(b)(1) of the Act, 21 U.S.C.section 360bbb-3(b)(1), unless the authorization is terminated  or revoked sooner.       Influenza A by PCR NEGATIVE NEGATIVE Final   Influenza B by PCR NEGATIVE NEGATIVE Final    Comment: (NOTE) The Xpert Xpress SARS-CoV-2/FLU/RSV plus assay is intended as an aid in the diagnosis of influenza from Nasopharyngeal swab specimens and should not be used as a sole basis for treatment. Nasal washings and aspirates are unacceptable for Xpert Xpress SARS-CoV-2/FLU/RSV testing.  Fact Sheet for  Patients: BloggerCourse.com  Fact Sheet for Healthcare Providers: SeriousBroker.it  This test is not yet approved or cleared by the [place] FDA and has been authorized for detection and/or diagnosis of SARS-CoV-2 by FDA under an Emergency Use Authorization (EUA). This EUA will remain in effect (meaning this test can be used) for the duration of the COVID-19 declaration under Section 564(b)(1) of the Act, 21 U.S.C. section 360bbb-3(b)(1), unless the authorization is terminated or revoked.  Performed at [facility], [address], [place], [place] [address]       Radiology Studies: CT ABDOMEN PELVIS W CONTRAST  Result Date: 02/18/2021 CLINICAL DATA:  Abdominal pain and nausea. EXAM: CT ABDOMEN AND PELVIS WITH CONTRAST TECHNIQUE: Multidetector CT imaging of the abdomen and pelvis was performed using the standard protocol following bolus administration of intravenous contrast. CONTRAST:  29mL OMNIPAQUE IOHEXOL 350 MG/ML SOLN COMPARISON:  None. FINDINGS: Lower chest: Small right and tiny left pleural effusions. Hepatobiliary: No suspicious focal abnormality within the liver parenchyma. Gallbladder wall thickening and/or trace pericholecystic fluid noted. No calcified gallstones evident by CT. No intrahepatic or extrahepatic biliary dilation. Pancreas: No focal mass lesion. No dilatation of the main duct. No intraparenchymal cyst. No peripancreatic edema. Spleen: Wedge-shaped subcapsular focus of hypoperfusion in the inferior spleen (well seen coronal image 65/series 5) is compatible with infarct. Adrenals/Urinary Tract: No adrenal nodule or mass. 11 mm probable cyst noted interpolar right kidney with no suspicious enhancing abnormality evident in either kidney. No evidence for hydroureter. The urinary bladder appears normal for the degree of distention. Stomach/Bowel: Stomach is nondistended. Duodenum is normally positioned as is the ligament of  Treitz. No small bowel wall thickening. No small bowel dilatation. The terminal ileum is normal. The appendix is normal. No gross colonic mass. No colonic wall thickening. No substantial diverticular disease evident. No findings of diverticulitis. Vascular/Lymphatic: No abdominal aortic aneurysm. No abdominal aortic atherosclerotic calcification. There is no gastrohepatic or hepatoduodenal ligament lymphadenopathy. No retroperitoneal or mesenteric lymphadenopathy. No pelvic sidewall lymphadenopathy. Reproductive: The uterus is unremarkable.  There is no adnexal mass. Other: There is some trace fluid in the posterior pelvis, indeterminate. Musculoskeletal: No worrisome lytic or sclerotic osseous abnormality. Degenerative changes are noted in the hips and at the symphysis pubis. IMPRESSION: 1. Small, wedge-shaped subcapsular focus of hypoperfusion in the inferior spleen compatible with infarct. 2. Gallbladder wall thickening and/or trace pericholecystic fluid. No calcified gallstones evident by CT. Right upper quadrant ultrasound suggested to further  evaluate. 3. Small right and tiny left pleural effusions. 4. Trace fluid in the posterior pelvis, indeterminate. Electronically Signed   By: Kennith CenterEric  Mansell M.D.   On: 02/18/2021 22:03   DG Chest Port 1 View  Result Date: 02/18/2021 CLINICAL DATA:  Shortness of breath EXAM: PORTABLE CHEST 1 VIEW COMPARISON:  04/10/2019 FINDINGS: Cardiac shadow is mildly prominent but accentuated by the portable technique. The lungs are well aerated bilaterally. Mild interstitial changes are seen felt to be of a chronic nature given their long-term stability. No focal infiltrate is noted. No sizable effusion is seen. No bony abnormality is noted. IMPRESSION: No acute abnormality noted. Electronically Signed   By: Alcide CleverMark  Lukens M.D.   On: 02/18/2021 19:29   ECHOCARDIOGRAM COMPLETE  Result Date: 02/19/2021    ECHOCARDIOGRAM REPORT   Patient Name:   Jo Knapp Date of Exam:  02/19/2021 Medical Rec #:  [id number]        Height:       66.0 in Accession #:    [id number]       Weight:       210.0 lb Date of Birth:  [birth year]       BSA:          2.042 m Patient Age:    59 years         BP:           168/62 mmHg Patient Gender: F                HR:           77 bpm. [facility] Procedure: 2D Echo, Cardiac Doppler and Color Doppler Indications:    Atrial Fibrillation  History:        Patient has no prior history of Echocardiogram examinations.                 Arrythmias:LBBB and Atrial Fibrillation. Pulmonary embolus.  Sonographer:    Mikki Harbororothy Buchanan Referring Phys: [id number] ASIA B ZIERLE-GHOSH IMPRESSIONS  1. Diffuse hypokinesis, worse in the inferior and inferoseptal walls. . Left ventricular ejection fraction, by estimation, is 25 to 30%. The left ventricle has severely decreased function. The left ventricular internal cavity size was mildly dilated. There is mild left ventricular hypertrophy. Left ventricular diastolic parameters are consistent with Grade II diastolic dysfunction (pseudonormalization).  2. Right ventricular systolic function is moderately reduced. The right ventricular size is normal. There is mildly elevated pulmonary artery systolic pressure.  3. Left atrial size was mild to moderately dilated.  4. Right atrial size was mild to moderately dilated.  5. The mitral valve is normal in structure. Mild mitral valve regurgitation.  6. The aortic valve is tricuspid. Aortic valve regurgitation is trivial. Mild to moderate aortic valve sclerosis/calcification is present, without any evidence of aortic stenosis.  7. There is mild dilatation of the ascending aorta, measuring 40 mm.  8. The inferior vena cava is dilated in size with <50% respiratory variability, suggesting right atrial pressure of 15 mmHg. FINDINGS  Left Ventricle: Diffuse hypokinesis, worse in the inferior and inferoseptal walls. Left ventricular ejection fraction, by estimation, is 25 to 30%. The  left ventricle has severely decreased function. The left ventricular internal cavity size was mildly dilated. There is mild left ventricular hypertrophy. Left ventricular diastolic parameters are consistent with Grade II diastolic dysfunction (pseudonormalization). Right Ventricle: The right ventricular size is normal. Right vetricular wall thickness was not assessed. Right ventricular systolic function is moderately reduced. There is  mildly elevated pulmonary artery systolic pressure. The tricuspid regurgitant velocity is 2.96 m/s, and with an assumed right atrial pressure of 8 mmHg, the estimated right ventricular systolic pressure is 43.0 mmHg. Left Atrium: Left atrial size was mild to moderately dilated. Right Atrium: Right atrial size was mild to moderately dilated. Pericardium: Trivial pericardial effusion is present. Mitral Valve: The mitral valve is normal in structure. Mild mitral annular calcification. Mild mitral valve regurgitation. MV peak gradient, 1.7 mmHg. The mean mitral valve gradient is 1.0 mmHg. Tricuspid Valve: The tricuspid valve is normal in structure. Tricuspid valve regurgitation is trivial. Aortic Valve: The aortic valve is tricuspid. Aortic valve regurgitation is trivial. Mild to moderate aortic valve sclerosis/calcification is present, without any evidence of aortic stenosis. Aortic valve mean gradient measures 2.0 mmHg. Aortic valve peak  gradient measures 3.0 mmHg. Aortic valve area, by VTI measures 3.03 cm. Pulmonic Valve: The pulmonic valve was normal in structure. Pulmonic valve regurgitation is trivial. Aorta: The aortic root is normal in size and structure. There is mild dilatation of the ascending aorta, measuring 40 mm. Venous: The inferior vena cava is dilated in size with less than 50% respiratory variability, suggesting right atrial pressure of 15 mmHg. IAS/Shunts: No atrial level shunt detected by color flow Doppler.  LEFT VENTRICLE PLAX 2D LVIDd:         5.25 cm       Diastology LVIDs:         4.59 cm      LV e' medial:    4.84 cm/s LV PW:         1.31 cm      LV E/e' medial:  12.2 LV IVS:        1.29 cm      LV e' lateral:   8.38 cm/s LVOT diam:     2.20 cm      LV E/e' lateral: 7.1 LV SV:         48 LV SV Index:   23 LVOT Area:     3.80 cm  LV Volumes (MOD) LV vol d, MOD A2C: 143.0 ml LV vol d, MOD A4C: 146.0 ml LV vol s, MOD A2C: 94.4 ml LV vol s, MOD A4C: 92.6 ml LV SV MOD A2C:     48.6 ml LV SV MOD A4C:     146.0 ml LV SV MOD BP:      51.3 ml RIGHT VENTRICLE RV Basal diam:  3.74 cm RV Mid diam:    2.82 cm RV S prime:     6.15 cm/s TAPSE (M-mode): 1.8 cm LEFT ATRIUM             Index       RIGHT ATRIUM           Index LA diam:        4.30 cm 2.11 cm/m  RA Area:     23.20 cm LA Vol (A2C):   78.9 ml 38.64 ml/m RA Volume:   74.60 ml  36.54 ml/m LA Vol (A4C):   84.3 ml 41.29 ml/m LA Biplane Vol: 84.2 ml 41.24 ml/m  AORTIC VALVE AV Area (Vmax):    2.58 cm AV Area (Vmean):   2.37 cm AV Area (VTI):     3.03 cm AV Vmax:           87.10 cm/s AV Vmean:          62.800 cm/s AV VTI:  0.158 m AV Peak Grad:      3.0 mmHg AV Mean Grad:      2.0 mmHg LVOT Vmax:         59.20 cm/s LVOT Vmean:        39.100 cm/s LVOT VTI:          0.126 m LVOT/AV VTI ratio: 0.80  AORTA Ao Root diam: 3.30 cm Ao Asc diam:  4.00 cm MITRAL VALVE               TRICUSPID VALVE MV Area (PHT): 4.15 cm    TR Peak grad:   35.0 mmHg MV Area VTI:   2.99 cm    TR Vmax:        296.00 cm/s MV Peak grad:  1.7 mmHg MV Mean grad:  1.0 mmHg    SHUNTS MV Vmax:       0.65 m/s    Systemic VTI:  0.13 m MV Vmean:      35.9 cm/s   Systemic Diam: 2.20 cm MV Decel Time: 183 msec MV E velocity: 59.10 cm/s MV A velocity: 31.60 cm/s MV E/A ratio:  1.87 Dietrich Pates MD Electronically signed by Dietrich Pates MD Signature Date/Time: 02/19/2021/4:54:50 PM    Final    US Abdomen Limited RUQ (LIVER/GB)  Result Date: 02/19/2021 CLINICAL DATA:  Abnormal CT with gallbladder wall thickening/fluid. Abdominal pain. EXAM: ULTRASOUND  ABDOMEN LIMITED RIGHT UPPER QUADRANT COMPARISON:  CT 1 day prior FINDINGS: Gallbladder: No gallstones. Mild gallbladder wall thickening at up to 5 mm. Sonographic Murphy's sign was not elicited. No pericholecystic fluid. Common bile duct: Diameter: Normal, 3 mm. Liver: No focal lesion identified. Within normal limits in parenchymal echogenicity. Portal vein is patent on color Doppler imaging with normal direction of blood flow towards the liver. Other: Small right pleural effusion, as on CT. IMPRESSION: 1. Mild nonspecific gallbladder wall thickening. No gallstones or specific evidence of acute cholecystitis 2. Right pleural effusion. Electronically Signed   By: Jeronimo Greaves M.D.   On: 02/19/2021 11:17      Scheduled Meds:  amLODipine  5 mg Oral Daily   apixaban  5 mg Oral BID   losartan  50 mg Oral Daily   Continuous Infusions:   LOS: 0 days      Time spent: 35 minutes   Noralee Stain, DO [facility] 02/20/2021, 8:44 AM   Available via Epic secure chat 7am-7pm After these hours, please refer to coverage provider listed on [email or web address]

## 2021-02-21 ENCOUNTER — Encounter (HOSPITAL_COMMUNITY): Payer: Self-pay | Admitting: Internal Medicine

## 2021-02-21 DIAGNOSIS — I1A0 Resistant hypertension: Secondary | ICD-10-CM | POA: Diagnosis present

## 2021-02-21 DIAGNOSIS — R7401 Elevation of levels of liver transaminase levels: Secondary | ICD-10-CM | POA: Diagnosis present

## 2021-02-21 DIAGNOSIS — I10 Essential (primary) hypertension: Secondary | ICD-10-CM | POA: Diagnosis present

## 2021-02-21 DIAGNOSIS — I509 Heart failure, unspecified: Secondary | ICD-10-CM

## 2021-02-21 LAB — COMPREHENSIVE METABOLIC PANEL
ALT: 321 U/L — ABNORMAL HIGH (ref 0–44)
AST: 166 U/L — ABNORMAL HIGH (ref 15–41)
Albumin: 3.4 g/dL — ABNORMAL LOW (ref 3.5–5.0)
Alkaline Phosphatase: 108 U/L (ref 38–126)
Anion gap: 8 (ref 5–15)
BUN: 17 mg/dL (ref 6–20)
CO2: 29 mmol/L (ref 22–32)
Calcium: 8.9 mg/dL (ref 8.9–10.3)
Chloride: 102 mmol/L (ref 98–111)
Creatinine, Ser: 0.85 mg/dL (ref 0.44–1.00)
GFR, Estimated: >60 mL/min (ref >60)
Glucose, Bld: 103 mg/dL — ABNORMAL HIGH (ref 70–99)
Potassium: 3.6 mmol/L (ref 3.5–5.1)
Sodium: 139 mmol/L (ref 135–145)
Total Bilirubin: 0.8 mg/dL (ref 0.3–1.2)
Total Protein: 6.3 g/dL — ABNORMAL LOW (ref 6.5–8.1)

## 2021-02-21 LAB — MAGNESIUM: Magnesium: 1.9 mg/dL (ref 1.7–2.4)

## 2021-02-21 MED ORDER — METOPROLOL SUCCINATE ER 50 MG PO TB24
50.0000 mg | ORAL_TABLET | Freq: Every day | ORAL | Status: DC
  Administered 2021-02-22: 50 mg via ORAL
  Filled 2021-02-21: qty 1

## 2021-02-21 MED ORDER — FUROSEMIDE 10 MG/ML IJ SOLN
40.0000 mg | Freq: Once | INTRAMUSCULAR | Status: AC
  Administered 2021-02-21: 40 mg via INTRAVENOUS
  Filled 2021-02-21: qty 4

## 2021-02-21 MED ORDER — METOPROLOL SUCCINATE ER 25 MG PO TB24
25.0000 mg | ORAL_TABLET | Freq: Every day | ORAL | Status: AC
  Administered 2021-02-21: 25 mg via ORAL
  Filled 2021-02-21: qty 1

## 2021-02-21 MED ORDER — SPIRONOLACTONE 25 MG PO TABS
12.5000 mg | ORAL_TABLET | Freq: Every day | ORAL | Status: DC
  Administered 2021-02-21 – 2021-02-22 (×2): 12.5 mg via ORAL
  Filled 2021-02-21 (×3): qty 0.5
  Filled 2021-02-21: qty 1
  Filled 2021-02-21: qty 0.5

## 2021-02-21 NOTE — Discharge Instructions (Addendum)
1)You are taking Eliquis/Apixaban which is a blood thinner so please Avoid ibuprofen/Advil/Aleve/Motrin/Goody Powders/Naproxen/BC powders/Meloxicam/Diclofenac/Indomethacin and other Nonsteroidal anti-inflammatory medications as these will make you more likely to bleed and can cause stomach ulcers, can also cause Kidney problems.   2)Follow up with cardiologist as advised for further adjustment of your medication  3)Very low-salt diet advised  4)Weigh yourself daily, call if you gain more than 3 pounds in 1 day or more than 5 pounds in 1 week as your diuretic medications may need to be adjusted  5)Stop Losartan and Stop HCTZ/hydrochlorothiazide

## 2021-02-21 NOTE — Plan of Care (Signed)
Nutrition Education Note  RD consulted for nutrition education regarding new CHF.  RD provided "Low Sodium Nutrition Therapy" handout from the Academy of [employer]. Reviewed patient's dietary recall. Provided examples on ways to decrease sodium intake in diet. Discouraged intake of processed foods and use of salt shaker. Encouraged fresh fruits and vegetables as well as whole grain sources of carbohydrates to maximize fiber intake.   RD discussed why it is important for patient to adhere to diet recommendations, and emphasized the role of fluids, foods to avoid, and importance of weighing self daily. Teach back method used.  Expect fair compliance.  Body mass index is 33.89 kg/m. Pt meets criteria for Obesity Class 1 based on current BMI.  Current diet order is Carb Modified/Heart Healthy, patient is consuming approximately 50-75% of meals at this time. Labs and medications reviewed.   No further nutrition interventions warranted at this time. RD contact information provided. If additional nutrition issues arise, please re-consult RD.   Vertell Limber, RD, LDN (she/her/hers) Registered Dietitian I After-Hours/Weekend Pager # in [place]

## 2021-02-21 NOTE — Progress Notes (Addendum)
Progress Note  Patient Name: Jo Knapp Date of Encounter: 02/21/2021  [facility] Cardiologist: New to [facility]  Subjective   Breathing improved but still with orthopnea overnight. No chest pain or palpitations.    Inpatient Medications    Scheduled Meds:  apixaban  5 mg Oral BID   metoprolol succinate  25 mg Oral Daily   sacubitril-valsartan  1 tablet Oral BID   spironolactone  12.5 mg Oral Daily   Continuous Infusions:  PRN Meds: acetaminophen **OR** acetaminophen, metoprolol tartrate, morphine injection, ondansetron **OR** ondansetron (ZOFRAN) IV, oxyCODONE, polyethylene glycol, senna   Vital Signs    Vitals:   02/20/21 1226 02/20/21 2148 02/21/21 0555 02/21/21 0811  BP: (!) 151/99 (!) 156/109 (!) 158/101 (!) 163/114  Pulse: 82 82 80 82  Resp: 18 20 18    Temp: 97.9 F (36.6 C) 98 F (36.7 C) 98.3 F (36.8 C)   TempSrc: Oral Oral Oral   SpO2: 98% 100% 93%   Weight:      Height:       No intake or output data in the 24 hours ending 02/21/21 0909 Last 3 Weights 02/18/2021 04/10/2020 04/08/2020  Weight (lbs) 210 lb 205 lb 0.4 oz 210 lb  Weight (kg) 95.255 kg 93 kg 95.255 kg      Telemetry    NSR, HR in 70's to 80's. Frequrnt PVC's and occasional episodes of ventricular bigeminy.  - Personally Reviewed  ECG    No new tracings.   Physical Exam   GEN: No acute distress.   Neck: No JVD Cardiac: RRR with occasional ectopic beats.No murmurs, rubs, or gallops.  Respiratory: Clear to auscultation bilaterally. GI: Soft, nontender, non-distended  MS: No edema; No deformity. Neuro:  Nonfocal  Psych: Normal affect   Labs    High Sensitivity Troponin:   Recent Labs  Lab 02/18/21 1849 02/18/21 2218  TROPONINIHS 4 4      Chemistry Recent Labs  Lab 02/19/21 0530 02/20/21 0408 02/21/21 0501  NA 137 138 139  K 3.4* 3.4* 3.6  CL 105 105 102  CO2 27 27 29   GLUCOSE 133* 101* 103*  BUN 13 14 17   CREATININE 0.84 0.88 0.85  CALCIUM 8.1* 8.6* 8.9   PROT 7.0 5.9* 6.3*  ALBUMIN 3.7 3.3* 3.4*  AST 153* 147* 166*  ALT 308* 277* 321*  ALKPHOS 121 106 108  BILITOT 1.0 0.8 0.8  GFRNONAA >60 >60 >60  ANIONGAP 5 6 8      Hematology Recent Labs  Lab 02/18/21 1849 02/19/21 0530 02/20/21 0408  WBC 8.6 8.4 7.1  RBC 4.59 4.42 4.15  HGB 14.1 13.5 12.4  HCT 42.8 41.3 39.0  MCV 93.2 93.4 94.0  MCH 30.7 30.5 29.9  MCHC 32.9 32.7 31.8  RDW 14.6 14.6 14.6  PLT 297 280 260    BNPNo results for input(s): BNP, PROBNP in the last 168 hours.   DDimer No results for input(s): DDIMER in the last 168 hours.   Radiology     Abdomen Limited RUQ (LIVER/GB)  Result Date: 02/19/2021 CLINICAL DATA:  Abnormal CT with gallbladder wall thickening/fluid. Abdominal pain. EXAM: ULTRASOUND ABDOMEN LIMITED RIGHT UPPER QUADRANT COMPARISON:  CT 1 day prior FINDINGS: Gallbladder: No gallstones. Mild gallbladder wall thickening at up to 5 mm. Sonographic Murphy's sign was not elicited. No pericholecystic fluid. Common bile duct: Diameter: Normal, 3 mm. Liver: No focal lesion identified. Within normal limits in parenchymal echogenicity. Portal vein is patent on color Doppler imaging with normal direction  of blood flow towards the liver. Other: Small right pleural effusion, as on CT. IMPRESSION: 1. Mild nonspecific gallbladder wall thickening. No gallstones or specific evidence of acute cholecystitis 2. Right pleural effusion. Electronically Signed   By: Jeronimo Greaves M.D.   On: 02/19/2021 11:17    Cardiac Studies   Echocardiogram: 02/19/2021 IMPRESSIONS     1. Diffuse hypokinesis, worse in the inferior and inferoseptal walls. .  Left ventricular ejection fraction, by estimation, is 25 to 30%. The left  ventricle has severely decreased function. The left ventricular internal  cavity size was mildly dilated.  There is mild left ventricular hypertrophy. Left ventricular diastolic  parameters are consistent with Grade II diastolic dysfunction   (pseudonormalization).   2. Right ventricular systolic function is moderately reduced. The right  ventricular size is normal. There is mildly elevated pulmonary artery  systolic pressure.   3. Left atrial size was mild to moderately dilated.   4. Right atrial size was mild to moderately dilated.   5. The mitral valve is normal in structure. Mild mitral valve  regurgitation.   6. The aortic valve is tricuspid. Aortic valve regurgitation is trivial.  Mild to moderate aortic valve sclerosis/calcification is present, without  any evidence of aortic stenosis.   7. There is mild dilatation of the ascending aorta, measuring 40 mm.   8. The inferior vena cava is dilated in size with <50% respiratory  variability, suggesting right atrial pressure of 15 mmHg.   Patient Profile     60 y.o. 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   Assessment & Plan    1. HFrEF - Echo this admission shows a reduced EF of 25-30% in the setting of newly diagnosed atrial fibrillation with RVR. Expect a tachycardia-mediated cardiomyopathy and would anticipate a repeat echocardiogram in 3 months for reassessment of her EF. If still reduced, would plan for ischemic evaluation at that time.  - She was previously on Amlodipine, Losartan and intermittent doses of IV Lopressor. Amlodipine has been discontinued. She has been started on Toprol-XL 25mg  daily and Entresto 24-26mg  BID. Will add Spironolactone 12.5mg  daily and titrate Toprol-XL to 50mg  daily. Would consider adding an SGLT2 inhibitor therapy as an outpatient to optimize GDMT.  Will ask dietician to see patient. Discussed daily weights with patient.   2. Atrial Fibrillation with RVR - New diagnosis for the patient this admission and she converted back to NSR with IV Lopressor while in the  ED.  - She has been started on Toprol-XL 25mg  daily. Discussed with Dr. and will titrate to 50mg  daily given her frequent PVC's and intermittent ventricular bigeminy as well as her reduced EF. - Her CHA2DS2-VASc Score is 5 and she has been restarted on Eliquis 5mg  BID for anticoagulation.    3. Splenic Infarct/Elevated LFT's - Noted on CT this admission and she has been restarted on Eliquis for anticoagulation. LFT's remain elevated with AST at 166 and ALT 321 this AM. Abdominal showed gallbladder wall thickening with no evidence of cholecystitis. Further management per the admitting team.    4. Hypokalemia -K+ improved, now at 3.6 today. Starting Spironolactone as outlined above which should help with her K+ levels as well as LV dysfunction   She is a possible discharge over the weekend but NOT today till  we see if changes made help her resolver her symptoms. Will arrange for outpatient follow-up in our [place] office.   For questions or updates, please contact [facility] Please consult [email or web address] for contact info under        Signed, Colvin Caroli 02/21/2021, 9:09 AM

## 2021-02-21 NOTE — Plan of Care (Signed)
  Problem: Education: Goal: Knowledge of General Education information will improve Description: Including pain rating scale, medication(s)/side effects and non-pharmacologic comfort measures 02/21/2021 2325 by Karolee Ohs, RN Outcome: Progressing 02/21/2021 2325 by Karolee Ohs, RN Outcome: Progressing

## 2021-02-21 NOTE — TOC Benefit Eligibility Note (Signed)
Transition of Care [facility]) Benefit Eligibility Note    Patient Details  Name: Jo Knapp MRN: [id number] Date of Birth: [birth year]   Medication/Dose: eliquis 5 mg twice daily  Covered?: Yes  Tier: 3 Drug     Spoke with Person/Company/Phone Number:: Carla with [employer] at [phone]  Co-Pay: $200  Prior Approval: No  Deductible: Unmet ($4,950)       Corey Harold Phone Number: 02/21/2021, 4:21 PM

## 2021-02-21 NOTE — Plan of Care (Signed)
  Problem: Education: Goal: Knowledge of General Education information will improve Description Including pain rating scale, medication(s)/side effects and non-pharmacologic comfort measures Outcome: Progressing   Problem: Health Behavior/Discharge Planning: Goal: Ability to manage health-related needs will improve Outcome: Progressing   

## 2021-02-21 NOTE — Progress Notes (Signed)
PROGRESS NOTE    Jo Knapp  EXB:[id number] DOB: Jun 22, 1961 DOA: 02/18/2021 PCP: Oneal Grout, FNP     Brief Narrative:  Jo Knapp  is a 60 y.o. female, with history of hypertension, and PE, presents the ED with a chief complaint of dyspnea.  Patient reports that she has had dyspnea, nausea, vomiting, lower abdominal pain, and dizziness.  CT abdomen shows small wedge-shaped subscapular focus hypoperfusion in the inferior spleen compatible with infarct.  Metoprolol given in the ED to abort A. fib RVR.  New events last 24 hours / Subjective: The patient was seen and examined this morning stating mild pressure type chest pain earlier this morning which was resolved... Improving shortness of breath    Assessment & Plan:   Active Problems:   Atrial fibrillation with RVR (HCC)   A Fib RVR, new onset A Fib -CHA2DS2-VASc score 3 -Patient is on Eliquis for previous PE, continue -Heart rate improving with metoprolol XL 50 mg  Acute combined systolic and diastolic heart failure -EF significantly decreased from previous echocardiogram available from [facility] in 2017.  EF 25-30%, grade 2 diastolic dysfunction, diffuse hypokinesis -Cardiology consulted;  initiated AldactoneToprol-XL, Entresto and spironolactone   Splenic infarct -Continue Eliquis  Elevated liver enzymes -Right upper quadrant ultrasound with nonspecific gallbladder wall thickening, without gallstones or evidence of acute cholecystitis -Improving  History of PE -Continue Eliquis -  Hypokalemia -Trending and repleting accordingly  HTN -Continue cozaar, and amlodipine has been discontinued by cardiology Start Toprol-XL, Entresto and spironolactone   DVT prophylaxis:  SCDs Start: 02/19/21 0343 apixaban (ELIQUIS) tablet 5 mg  Code Status: Full CODE   Family Communication: No family at bedside Disposition Plan:  Status is: Observation  The patient will require care spanning > 2 midnights and should be  moved to inpatient because: Ongoing diagnostic testing needed not appropriate for outpatient work up  Dispo: The patient is from: Home              Anticipated d/c is to: Home in a.m. if remains stable              Patient currently is not medically stable to d/c.   Difficult to place patient No      Consultants:  Cardiology  Procedures:  None  Antimicrobials:  Anti-infectives (From admission, onward)    None        Objective: Vitals:   02/21/21 0555 02/21/21 0811 02/21/21 1224 02/21/21 1239  BP: (!) 158/101 (!) 163/114 (!) 152/104 (!) 153/98  Pulse: 80 82 80 82  Resp: 18   16  Temp: 98.3 F (36.8 C)   98.3 F (36.8 C)  TempSrc: Oral   Oral  SpO2: 93%   91%  Weight:      Height:        Intake/Output Summary (Last 24 hours) at 02/21/2021 1328 Last data filed at 02/21/2021 1159 Gross per 24 hour  Intake 240 ml  Output --  Net 240 ml   Filed Weights   02/18/21 1829  Weight: 95.3 kg      Physical Exam:   General:  Alert, oriented, cooperative, no distress;   HEENT:  Normocephalic, PERRL, otherwise with in Normal limits   Neuro:  CNII-XII intact. , normal motor and sensation, reflexes intact   Lungs:   Clear to auscultation BL, Respirations unlabored, no wheezes / crackles  Cardio:    S1/S2, RRR, No murmure, No Rubs or Gallops   Abdomen:   Soft, non-tender, bowel sounds active  all four quadrants,  no guarding or peritoneal signs.  Muscular skeletal:  Limited exam - in bed, able to move all 4 extremities, Normal strength,  2+ pulses,  symmetric, No pitting edema  Skin:  Dry, warm to touch, negative for any Rashes,  Wounds: Please see nursing documentation         Data Reviewed: I have personally reviewed following labs and imaging studies  CBC: Recent Labs  Lab 02/18/21 1849 02/19/21 0530 02/20/21 0408  WBC 8.6 8.4 7.1  HGB 14.1 13.5 12.4  HCT 42.8 41.3 39.0  MCV 93.2 93.4 94.0  PLT 297 280 260   Basic Metabolic Panel: Recent Labs  Lab  02/18/21 1849 02/19/21 0530 02/20/21 0408 02/21/21 0501  NA 138 137 138 139  K 3.2* 3.4* 3.4* 3.6  CL 105 105 105 102  CO2 24 27 27 29   GLUCOSE 113* 133* 101* 103*  BUN 13 13 14 17   CREATININE 0.79 0.84 0.88 0.85  CALCIUM 8.8* 8.1* 8.6* 8.9  MG 1.9 1.9 2.0 1.9   GFR: Estimated Creatinine Clearance: 82.9 mL/min (by C-G formula based on SCr of 0.85 mg/dL). Liver Function Tests: Recent Labs  Lab 02/18/21 2218 02/19/21 0530 02/20/21 0408 02/21/21 0501  AST 177* 153* 147* 166*  ALT 350* 308* 277* 321*  ALKPHOS 133* 121 106 108  BILITOT 1.0 1.0 0.8 0.8  PROT 7.2 7.0 5.9* 6.3*  ALBUMIN 3.9 3.7 3.3* 3.4*   Recent Labs  Lab 02/18/21 2218  LIPASE 35   No results for input(s): AMMONIA in the last 168 hours. Coagulation Profile: No results for input(s): INR, PROTIME in the last 168 hours. Cardiac Enzymes: No results for input(s): CKTOTAL, CKMB, CKMBINDEX, TROPONINI in the last 168 hours. BNP (last 3 results) No results for input(s): PROBNP in the last 8760 hours. HbA1C: No results for input(s): HGBA1C in the last 72 hours. CBG: No results for input(s): GLUCAP in the last 168 hours. Lipid Profile: No results for input(s): CHOL, HDL, LDLCALC, TRIG, CHOLHDL, LDLDIRECT in the last 72 hours. Thyroid Function Tests: Recent Labs    02/18/21 1849  TSH 2.122   Anemia Panel: No results for input(s): VITAMINB12, FOLATE, FERRITIN, TIBC, IRON, RETICCTPCT in the last 72 hours. Sepsis Labs: No results for input(s): PROCALCITON, LATICACIDVEN in the last 168 hours.  Recent Results (from the past 240 hour(s))  Resp Panel by RT-PCR (Flu A&B, Covid) Nasopharyngeal Swab     Status: None   Collection Time: 02/19/21 12:16 AM   Specimen: Nasopharyngeal Swab; Nasopharyngeal(NP) swabs in vial transport medium  Result Value Ref Range Status   SARS Coronavirus 2 by RT PCR NEGATIVE NEGATIVE Final    Comment: (NOTE) SARS-CoV-2 target nucleic acids are NOT DETECTED.  The SARS-CoV-2 RNA is  generally detectable in upper respiratory specimens during the acute phase of infection. The lowest concentration of SARS-CoV-2 viral copies this assay can detect is 138 copies/mL. A negative result does not preclude SARS-Cov-2 infection and should not be used as the sole basis for treatment or other patient management decisions. A negative result may occur with  improper specimen collection/handling, submission of specimen other than nasopharyngeal swab, presence of viral mutation(s) within the areas targeted by this assay, and inadequate number of viral copies(<138 copies/mL). A negative result must be combined with clinical observations, patient history, and epidemiological information. The expected result is Negative.  Fact Sheet for Patients:  04/20/21  Fact Sheet for Healthcare Providers:  04/21/21  This test is no t yet approved  or cleared by the [place] and  has been authorized for detection and/or diagnosis of SARS-CoV-2 by FDA under an Emergency Use Authorization (EUA). This EUA will remain  in effect (meaning this test can be used) for the duration of the COVID-19 declaration under Section 564(b)(1) of the Act, 21 U.S.C.section 360bbb-3(b)(1), unless the authorization is terminated  or revoked sooner.       Influenza A by PCR NEGATIVE NEGATIVE Final   Influenza B by PCR NEGATIVE NEGATIVE Final    Comment: (NOTE) The Xpert Xpress SARS-CoV-2/FLU/RSV plus assay is intended as an aid in the diagnosis of influenza from Nasopharyngeal swab specimens and should not be used as a sole basis for treatment. Nasal washings and aspirates are unacceptable for Xpert Xpress SARS-CoV-2/FLU/RSV testing.  Fact Sheet for Patients: BloggerCourse.com  Fact Sheet for Healthcare Providers: SeriousBroker.it  This test is not yet approved or cleared by the [place] FDA and has been authorized for detection and/or diagnosis of SARS-CoV-2 by FDA under an Emergency Use Authorization (EUA). This EUA will remain in effect (meaning this test can be used) for the duration of the COVID-19 declaration under Section 564(b)(1) of the Act, 21 U.S.C. section 360bbb-3(b)(1), unless the authorization is terminated or revoked.  Performed at [facility], [address], [place], [place] [address]       Radiology Studies: No results found.    Scheduled Meds:  apixaban  5 mg Oral BID   [START ON 02/22/2021] metoprolol succinate  50 mg Oral Daily   sacubitril-valsartan  1 tablet Oral BID   spironolactone  12.5 mg Oral Daily   Continuous Infusions:   LOS: 1 day      Time spent: 35 minutes   Kendell Bane, MD [facility] 02/21/2021, 1:28 PM   Available via Epic secure chat 7am-7pm After these hours, please refer to coverage provider listed on [email or web address]

## 2021-02-22 DIAGNOSIS — I2699 Other pulmonary embolism without acute cor pulmonale: Secondary | ICD-10-CM

## 2021-02-22 DIAGNOSIS — I5041 Acute combined systolic (congestive) and diastolic (congestive) heart failure: Secondary | ICD-10-CM

## 2021-02-22 MED ORDER — DAPAGLIFLOZIN PROPANEDIOL 5 MG PO TABS: 5.0000 mg | ORAL_TABLET | Freq: Every day | ORAL | 5 refills | Status: DC

## 2021-02-22 MED ORDER — SPIRONOLACTONE 25 MG PO TABS: 12.5000 mg | ORAL_TABLET | Freq: Every day | ORAL | 5 refills | Status: DC

## 2021-02-22 MED ORDER — ASCORBIC ACID 500 MG PO TABS: 500.0000 mg | ORAL_TABLET | Freq: Every day | ORAL | 5 refills | Status: DC

## 2021-02-22 MED ORDER — APIXABAN 5 MG PO TABS: 5.0000 mg | ORAL_TABLET | Freq: Two times a day (BID) | ORAL | 5 refills | Status: DC

## 2021-02-22 MED ORDER — ZINC SULFATE 220 (50 ZN) MG PO CAPS: 220.0000 mg | ORAL_CAPSULE | Freq: Every day | ORAL | 3 refills | Status: AC

## 2021-02-22 MED ORDER — OMEPRAZOLE 20 MG PO CPDR: 20.0000 mg | DELAYED_RELEASE_CAPSULE | Freq: Every day | ORAL | 3 refills | Status: DC

## 2021-02-22 MED ORDER — SACUBITRIL-VALSARTAN 49-51 MG PO TABS: 1.0000 | ORAL_TABLET | Freq: Two times a day (BID) | ORAL | 5 refills | Status: DC

## 2021-02-22 MED ORDER — SENNA 8.6 MG PO TABS: 1.0000 | ORAL_TABLET | Freq: Every day | ORAL | 0 refills | Status: AC | PRN

## 2021-02-22 MED ORDER — OMEPRAZOLE 20 MG PO CPDR
20.0000 mg | DELAYED_RELEASE_CAPSULE | Freq: Every day | ORAL | 3 refills | Status: DC
Start: 2021-02-22 — End: 2022-01-21

## 2021-02-22 MED ORDER — POTASSIUM CHLORIDE ER 10 MEQ PO TBCR: 10.0000 meq | EXTENDED_RELEASE_TABLET | Freq: Every day | ORAL | 2 refills | Status: DC

## 2021-02-22 MED ORDER — ACETAMINOPHEN 325 MG PO TABS: 650.0000 mg | ORAL_TABLET | Freq: Four times a day (QID) | ORAL | 0 refills | Status: AC | PRN

## 2021-02-22 MED ORDER — METOPROLOL SUCCINATE ER 50 MG PO TB24: 50.0000 mg | ORAL_TABLET | Freq: Every day | ORAL | 11 refills | Status: DC

## 2021-02-22 MED ORDER — AMLODIPINE BESYLATE 5 MG PO TABS
5.0000 mg | ORAL_TABLET | Freq: Every day | ORAL | Status: DC
  Administered 2021-02-22: 5 mg via ORAL
  Filled 2021-02-22: qty 1

## 2021-02-22 MED ORDER — SPIRONOLACTONE 25 MG PO TABS
12.5000 mg | ORAL_TABLET | Freq: Every day | ORAL | 5 refills | Status: DC
Start: 2021-02-23 — End: 2022-01-21

## 2021-02-22 NOTE — Discharge Summary (Signed)
Jo Knapp, is a 60 y.o. female  DOB [birth year]  MRN [id number].  Admission date:  02/18/2021  Admitting Physician  Noralee Stain, DO  Discharge Date:  02/22/2021   Primary MD  Oneal Grout, FNP  Recommendations for primary care physician for things to follow:   1)You are taking Eliquis/Apixaban which is a blood thinner so please Avoid ibuprofen/Advil/Aleve/Motrin/Goody Powders/Naproxen/BC powders/Meloxicam/Diclofenac/Indomethacin and other Nonsteroidal anti-inflammatory medications as these will make you more likely to bleed and can cause stomach ulcers, can also cause Kidney problems.   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] Ventricular bigeminy [I49.8] Atrial fibrillation with rapid ventricular response (HCC) [I48.91] Atrial fibrillation with RVR (HCC) [I48.91] Abdominal pain [R10.9]   Discharge Diagnosis  SOB (shortness of breath) [R06.02] 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] Ventricular bigeminy [I49.8] Atrial fibrillation with rapid ventricular response (HCC) [I48.91] Atrial fibrillation with RVR (HCC) [I48.91] Abdominal pain [R10.9]    Active Problems:   Acute pulmonary embolism (HCC)   Atrial fibrillation with RVR (HCC)   Acute CHF (congestive heart failure) (HCC)   Transaminitis   Essential hypertension      Past Medical History:  Diagnosis Date   HFrEF (heart failure with reduced ejection fraction) (HCC)    Hypertension    LV dysfunction    Paroxysmal A-fib (HCC)    Paroxysmal A-fib (HCC)     Past Surgical History:   Procedure Laterality Date   ANKLE SURGERY     CESAREAN SECTION WITH BILATERAL TUBAL LIGATION        HPI  from the history and physical done on the day of admission:   Jo Knapp  is a 60 y.o. female, with history of hypertension, and PE, presents the ED with a chief complaint of dyspnea.  Patient reports that she has had dyspnea, nausea, vomiting, lower abdominal pain, and dizziness.  This started a week ago.  She thought it was a stomach virus at first.  Last normal meal was more than a week ago.  Her last normal bowel movement was also more than a week ago.  She did have loose stools yesterday.  They were nonbloody, no melena.  Her lower abdominal pain comes in waves.  No provocative factors have been noticed.  She reports the pain feels like bad contractions.  Nothing relieves the pain.  The last 2 to 3 minutes and then it is gone spontaneously.  She has had this pain every day for a week.,  Approximately 5-6 times per day.  Patient also reports that she has been having palpitations.  This started 2 days ago.  Its worse with exertion.  Also worse with laying down.  Sitting up makes the palpitations  go away.  The palpitations are associated with her dyspnea but she is not sure if the dyspnea makes the palpitations worse or if the palpitations make the dyspnea worse.  Patient has no peripheral edema.  She has no known irregular heartbeat.  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  Patient also reports dizziness and headache.  She reports that she does not take her blood pressure at home.  She has taken Tylenol and its helped the intensity of her headache.  She had a change in her vision where she feels like she can see a reflection of the side of her body.  She has had no blind spots, floaters, blurry vision.  She reports that she has been wearing her glasses at home.  Patient has no other complaints at this time.   Patient does not smoke, she drinks wine once every 2  weeks, she does not use illicit drugs.  Patient is not vaccinated for COVID.  Patient is full code.   In the ED Temp 98.3, heart rate 41-153, respiratory rate 20-35, blood pressure highest 200/112, satting 97% No leukocytosis with a white blood cell count of 8.6, hemoglobin 14.1 Chemistry panel shows a slight hypokalemia at 3.2 Troponin 4, 4 TSH 2.122 CT abdomen shows small wedge-shaped subscapular focus hypoperfusion in the inferior spleen compatible with infarct.  Gallbladder wall thickening and or trace pericholecystic fluid.  Right upper quadrant ultrasound recommended. Chest x-ray shows no acute abnormality noted Metoprolol given in the ED to abort A. fib RVR      Hospital Course:   Brief Narrative:  Jo Knapp  is a 60 y.o. female, with history of hypertension, and PE, presents the ED with a chief complaint of dyspnea.  Patient reports that she has had dyspnea, nausea, vomiting, lower abdominal pain, and dizziness.  CT abdomen shows small wedge-shaped subscapular focus hypoperfusion in the inferior spleen compatible with infarct.  Metoprolol given in the ED to abort A. fib RVR.  A/p A Fib RVR, new onset A Fib -CHA2DS2-VASc score 3 -Patient is on Eliquis for previous VTE,  -Patient is back in sinus rhythm and has remained in sinus rhythm, -Okay to discharge on Eliquis and metoprolol   Acute combined systolic and diastolic heart failure -EF significantly decreased from previous echocardiogram available from [facility] in 2017.  EF 25-30%, grade 2 diastolic dysfunction, diffuse hypokinesis -Cardiology consulted;  initiated AldactoneToprol-XL, Entresto and spironolactone -Patient is advised to stop HCTZ and losartan -Low-salt diet and outpatient follow-up with cardiology advised -Patient aware that she may need AICD if EF does not improve as anticipated with medical management over the next 3 months or so -Outpatient follow-up with cardiology as advised    Splenic  infarct/VTE -Continue Eliquis   Elevated liver enzymes -Right upper quadrant ultrasound with nonspecific gallbladder wall thickening, without gallstones or evidence of acute cholecystitis -Clinically asymptomatic at this time  Hypokalemia -Give potassium supplementation  HTN Discharge on Toprol-XL, Entresto and spironolactone   Dispo: The patient is from: Home              Anticipated d/c is to: Home         Consultants:  Cardiology  Discharge Condition: stable  Follow UP   Follow-up Information     Ellsworth Lennox, PA-C Follow up on 03/13/2021.   Specialties: Physician Assistant, Cardiology Why: [facility] Follow-up on 03/13/2021 at 3:30 PM. Contact information: [address] [place] [address] [phone]  Diet and Activity recommendation:  As advised  Discharge Instructions    Discharge Instructions     Call MD for:  difficulty breathing, headache or visual disturbances   Complete by: As directed    Call MD for:  persistant dizziness or light-headedness   Complete by: As directed    Call MD for:  persistant nausea and vomiting   Complete by: As directed    Call MD for:  severe uncontrolled pain   Complete by: As directed    Call MD for:  temperature >100.4   Complete by: As directed    Diet - low sodium heart healthy   Complete by: As directed    Discharge instructions   Complete by: As directed    1)You are taking Eliquis/Apixaban which is a blood thinner so please Avoid ibuprofen/Advil/Aleve/Motrin/Goody Powders/Naproxen/BC powders/Meloxicam/Diclofenac/Indomethacin and other Nonsteroidal anti-inflammatory medications as these will make you more likely to bleed and can cause stomach ulcers, can also cause Kidney problems.   2)Follow up with cardiologist as advised for further adjustment of your medication  3)Very low-salt diet advised  4)Weigh yourself daily, call if you gain more than 3 pounds in 1 day or more than 5  pounds in 1 week as your diuretic medications may need to be adjusted  5)Stop Losartan and Stop HCTZ/hydrochlorothiazide   Increase activity slowly   Complete by: As directed         Discharge Medications     Allergies as of 02/22/2021   No Known Allergies      Medication List     STOP taking these medications    hydrochlorothiazide 25 MG tablet Commonly known as: HYDRODIURIL   losartan 50 MG tablet Commonly known as: COZAAR   predniSONE 20 MG tablet Commonly known as: Deltasone       TAKE these medications    acetaminophen 325 MG tablet Commonly known as: TYLENOL Take 2 tablets (650 mg total) by mouth every 6 (six) hours as needed for mild pain (or Fever >/= 101).   apixaban 5 MG Tabs tablet Commonly known as: ELIQUIS Take 1 tablet (5 mg total) by mouth 2 (two) times daily. Start around 05/12/20 after completing initial started pack What changed: Another medication with the same name was removed. Continue taking this medication, and follow the directions you see here.   ascorbic acid 500 MG tablet Commonly known as: VITAMIN C Take 1 tablet (500 mg total) by mouth daily.   dapagliflozin propanediol 5 MG Tabs tablet Commonly known as: FARXIGA Take 1 tablet (5 mg total) by mouth daily before breakfast. For Heart   metoprolol succinate 50 MG 24 hr tablet Commonly known as: TOPROL-XL Take 1 tablet (50 mg total) by mouth daily. Take with or immediately following a meal. Start taking on: February 23, 2021   omeprazole 20 MG capsule Commonly known as: PriLOSEC Take 1 capsule (20 mg total) by mouth daily.   potassium chloride 10 MEQ tablet Commonly known as: KLOR-CON Take 1 tablet (10 mEq total) by mouth daily. Take While taking Lasix/furosemide What changed:  medication strength how much to take when to take this additional instructions   sacubitril-valsartan 49-51 MG Commonly known as: ENTRESTO Take 1 tablet by mouth 2 (two) times daily. For Heart  Failure   senna 8.6 MG Tabs tablet Commonly known as: SENOKOT Take 1 tablet (8.6 mg total) by mouth daily as needed for mild constipation.   spironolactone 25 MG tablet Commonly known as: ALDACTONE Take 0.5 tablets (12.5 mg  total) by mouth daily. Start taking on: February 23, 2021   zinc sulfate 220 (50 Zn) MG capsule Take 1 capsule (220 mg total) by mouth daily.        Major procedures and Radiology Reports - PLEASE review detailed and final reports for all details, in brief -   CT ABDOMEN PELVIS W CONTRAST  Result Date: 02/18/2021 CLINICAL DATA:  Abdominal pain and nausea. EXAM: CT ABDOMEN AND PELVIS WITH CONTRAST TECHNIQUE: Multidetector CT imaging of the abdomen and pelvis was performed using the standard protocol following bolus administration of intravenous contrast. CONTRAST:  80mL OMNIPAQUE IOHEXOL 350 MG/ML SOLN COMPARISON:  None. FINDINGS: Lower chest: Small right and tiny left pleural effusions. Hepatobiliary: No suspicious focal abnormality within the liver parenchyma. Gallbladder wall thickening and/or trace pericholecystic fluid noted. No calcified gallstones evident by CT. No intrahepatic or extrahepatic biliary dilation. Pancreas: No focal mass lesion. No dilatation of the main duct. No intraparenchymal cyst. No peripancreatic edema. Spleen: Wedge-shaped subcapsular focus of hypoperfusion in the inferior spleen (well seen coronal image 65/series 5) is compatible with infarct. Adrenals/Urinary Tract: No adrenal nodule or mass. 11 mm probable cyst noted interpolar right kidney with no suspicious enhancing abnormality evident in either kidney. No evidence for hydroureter. The urinary bladder appears normal for the degree of distention. Stomach/Bowel: Stomach is nondistended. Duodenum is normally positioned as is the ligament of Treitz. No small bowel wall thickening. No small bowel dilatation. The terminal ileum is normal. The appendix is normal. No gross colonic mass. No colonic wall  thickening. No substantial diverticular disease evident. No findings of diverticulitis. Vascular/Lymphatic: No abdominal aortic aneurysm. No abdominal aortic atherosclerotic calcification. There is no gastrohepatic or hepatoduodenal ligament lymphadenopathy. No retroperitoneal or mesenteric lymphadenopathy. No pelvic sidewall lymphadenopathy. Reproductive: The uterus is unremarkable.  There is no adnexal mass. Other: There is some trace fluid in the posterior pelvis, indeterminate. Musculoskeletal: No worrisome lytic or sclerotic osseous abnormality. Degenerative changes are noted in the hips and at the symphysis pubis. IMPRESSION: 1. Small, wedge-shaped subcapsular focus of hypoperfusion in the inferior spleen compatible with infarct. 2. Gallbladder wall thickening and/or trace pericholecystic fluid. No calcified gallstones evident by CT. Right upper quadrant ultrasound suggested to further evaluate. 3. Small right and tiny left pleural effusions. 4. Trace fluid in the posterior pelvis, indeterminate. Electronically Signed   By: Kennith Center M.D.   On: 02/18/2021 22:03   DG Chest Port 1 View  Result Date: 02/18/2021 CLINICAL DATA:  Shortness of breath EXAM: PORTABLE CHEST 1 VIEW COMPARISON:  04/10/2019 FINDINGS: Cardiac shadow is mildly prominent but accentuated by the portable technique. The lungs are well aerated bilaterally. Mild interstitial changes are seen felt to be of a chronic nature given their long-term stability. No focal infiltrate is noted. No sizable effusion is seen. No bony abnormality is noted. IMPRESSION: No acute abnormality noted. Electronically Signed   By: Alcide Clever M.D.   On: 02/18/2021 19:29   ECHOCARDIOGRAM COMPLETE  Result Date: 02/19/2021    ECHOCARDIOGRAM REPORT   Patient Name:   Jo Knapp Date of Exam: 02/19/2021 Medical Rec #:  [id number]        Height:       66.0 in Accession #:    [id number]       Weight:       210.0 lb Date of Birth:  [birth year]       BSA:           2.042 m Patient Age:    11  years         BP:           168/62 mmHg Patient Gender: F                HR:           77 bpm. [facility] Procedure: 2D Echo, Cardiac Doppler and Color Doppler Indications:    Atrial Fibrillation  History:        Patient has no prior history of Echocardiogram examinations.                 Arrythmias:LBBB and Atrial Fibrillation. Pulmonary embolus.  Sonographer:    Mikki Harbor Referring Phys: [id number] ASIA B ZIERLE-GHOSH IMPRESSIONS  1. Diffuse hypokinesis, worse in the inferior and inferoseptal walls. . Left ventricular ejection fraction, by estimation, is 25 to 30%. The left ventricle has severely decreased function. The left ventricular internal cavity size was mildly dilated. There is mild left ventricular hypertrophy. Left ventricular diastolic parameters are consistent with Grade II diastolic dysfunction (pseudonormalization).  2. Right ventricular systolic function is moderately reduced. The right ventricular size is normal. There is mildly elevated pulmonary artery systolic pressure.  3. Left atrial size was mild to moderately dilated.  4. Right atrial size was mild to moderately dilated.  5. The mitral valve is normal in structure. Mild mitral valve regurgitation.  6. The aortic valve is tricuspid. Aortic valve regurgitation is trivial. Mild to moderate aortic valve sclerosis/calcification is present, without any evidence of aortic stenosis.  7. There is mild dilatation of the ascending aorta, measuring 40 mm.  8. The inferior vena cava is dilated in size with <50% respiratory variability, suggesting right atrial pressure of 15 mmHg. FINDINGS  Left Ventricle: Diffuse hypokinesis, worse in the inferior and inferoseptal walls. Left ventricular ejection fraction, by estimation, is 25 to 30%. The left ventricle has severely decreased function. The left ventricular internal cavity size was mildly dilated. There is mild left ventricular hypertrophy. Left ventricular  diastolic parameters are consistent with Grade II diastolic dysfunction (pseudonormalization). Right Ventricle: The right ventricular size is normal. Right vetricular wall thickness was not assessed. Right ventricular systolic function is moderately reduced. There is mildly elevated pulmonary artery systolic pressure. The tricuspid regurgitant velocity is 2.96 m/s, and with an assumed right atrial pressure of 8 mmHg, the estimated right ventricular systolic pressure is 43.0 mmHg. Left Atrium: Left atrial size was mild to moderately dilated. Right Atrium: Right atrial size was mild to moderately dilated. Pericardium: Trivial pericardial effusion is present. Mitral Valve: The mitral valve is normal in structure. Mild mitral annular calcification. Mild mitral valve regurgitation. MV peak gradient, 1.7 mmHg. The mean mitral valve gradient is 1.0 mmHg. Tricuspid Valve: The tricuspid valve is normal in structure. Tricuspid valve regurgitation is trivial. Aortic Valve: The aortic valve is tricuspid. Aortic valve regurgitation is trivial. Mild to moderate aortic valve sclerosis/calcification is present, without any evidence of aortic stenosis. Aortic valve mean gradient measures 2.0 mmHg. Aortic valve peak  gradient measures 3.0 mmHg. Aortic valve area, by VTI measures 3.03 cm. Pulmonic Valve: The pulmonic valve was normal in structure. Pulmonic valve regurgitation is trivial. Aorta: The aortic root is normal in size and structure. There is mild dilatation of the ascending aorta, measuring 40 mm. Venous: The inferior vena cava is dilated in size with less than 50% respiratory variability, suggesting right atrial pressure of 15 mmHg. IAS/Shunts: No atrial level shunt detected by color flow Doppler.  LEFT VENTRICLE  PLAX 2D LVIDd:         5.25 cm      Diastology LVIDs:         4.59 cm      LV e' medial:    4.84 cm/s LV PW:         1.31 cm      LV E/e' medial:  12.2 LV IVS:        1.29 cm      LV e' lateral:   8.38 cm/s LVOT  diam:     2.20 cm      LV E/e' lateral: 7.1 LV SV:         48 LV SV Index:   23 LVOT Area:     3.80 cm  LV Volumes (MOD) LV vol d, MOD A2C: 143.0 ml LV vol d, MOD A4C: 146.0 ml LV vol s, MOD A2C: 94.4 ml LV vol s, MOD A4C: 92.6 ml LV SV MOD A2C:     48.6 ml LV SV MOD A4C:     146.0 ml LV SV MOD BP:      51.3 ml RIGHT VENTRICLE RV Basal diam:  3.74 cm RV Mid diam:    2.82 cm RV S prime:     6.15 cm/s TAPSE (M-mode): 1.8 cm LEFT ATRIUM             Index       RIGHT ATRIUM           Index LA diam:        4.30 cm 2.11 cm/m  RA Area:     23.20 cm LA Vol (A2C):   78.9 ml 38.64 ml/m RA Volume:   74.60 ml  36.54 ml/m LA Vol (A4C):   84.3 ml 41.29 ml/m LA Biplane Vol: 84.2 ml 41.24 ml/m  AORTIC VALVE AV Area (Vmax):    2.58 cm AV Area (Vmean):   2.37 cm AV Area (VTI):     3.03 cm AV Vmax:           87.10 cm/s AV Vmean:          62.800 cm/s AV VTI:            0.158 m AV Peak Grad:      3.0 mmHg AV Mean Grad:      2.0 mmHg LVOT Vmax:         59.20 cm/s LVOT Vmean:        39.100 cm/s LVOT VTI:          0.126 m LVOT/AV VTI ratio: 0.80  AORTA Ao Root diam: 3.30 cm Ao Asc diam:  4.00 cm MITRAL VALVE               TRICUSPID VALVE MV Area (PHT): 4.15 cm    TR Peak grad:   35.0 mmHg MV Area VTI:   2.99 cm    TR Vmax:        296.00 cm/s MV Peak grad:  1.7 mmHg MV Mean grad:  1.0 mmHg    SHUNTS MV Vmax:       0.65 m/s    Systemic VTI:  0.13 m MV Vmean:      35.9 cm/s   Systemic Diam: 2.20 cm MV Decel Time: 183 msec MV E velocity: 59.10 cm/s MV A velocity: 31.60 cm/s MV E/A ratio:  1.87 Dietrich PatesPaula Ross MD Electronically signed by Dietrich PatesPaula Ross MD Signature Date/Time: 02/19/2021/4:54:50 PM    Final    US Abdomen  Limited RUQ (LIVER/GB)  Result Date: 02/19/2021 CLINICAL DATA:  Abnormal CT with gallbladder wall thickening/fluid. Abdominal pain. EXAM: ULTRASOUND ABDOMEN LIMITED RIGHT UPPER QUADRANT COMPARISON:  CT 1 day prior FINDINGS: Gallbladder: No gallstones. Mild gallbladder wall thickening at up to 5 mm. Sonographic Murphy's  sign was not elicited. No pericholecystic fluid. Common bile duct: Diameter: Normal, 3 mm. Liver: No focal lesion identified. Within normal limits in parenchymal echogenicity. Portal vein is patent on color Doppler imaging with normal direction of blood flow towards the liver. Other: Small right pleural effusion, as on CT. IMPRESSION: 1. Mild nonspecific gallbladder wall thickening. No gallstones or specific evidence of acute cholecystitis 2. Right pleural effusion. Electronically Signed   By: Jeronimo Greaves M.D.   On: 02/19/2021 11:17    Micro Results   Recent Results (from the past 240 hour(s))  Resp Panel by RT-PCR (Flu A&B, Covid) Nasopharyngeal Swab     Status: None   Collection Time: 02/19/21 12:16 AM   Specimen: Nasopharyngeal Swab; Nasopharyngeal(NP) swabs in vial transport medium  Result Value Ref Range Status   SARS Coronavirus 2 by RT PCR NEGATIVE NEGATIVE Final    Comment: (NOTE) SARS-CoV-2 target nucleic acids are NOT DETECTED.  The SARS-CoV-2 RNA is generally detectable in upper respiratory specimens during the acute phase of infection. The lowest concentration of SARS-CoV-2 viral copies this assay can detect is 138 copies/mL. A negative result does not preclude SARS-Cov-2 infection and should not be used as the sole basis for treatment or other patient management decisions. A negative result may occur with  improper specimen collection/handling, submission of specimen other than nasopharyngeal swab, presence of viral mutation(s) within the areas targeted by this assay, and inadequate number of viral copies(<138 copies/mL). A negative result must be combined with clinical observations, patient history, and epidemiological information. The expected result is Negative.  Fact Sheet for Patients:  BloggerCourse.com  Fact Sheet for Healthcare Providers:  SeriousBroker.it  This test is no t yet approved or cleared by the [place] FDA and  has been authorized for detection and/or diagnosis of SARS-CoV-2 by FDA under an Emergency Use Authorization (EUA). This EUA will remain  in effect (meaning this test can be used) for the duration of the COVID-19 declaration under Section 564(b)(1) of the Act, 21 U.S.C.section 360bbb-3(b)(1), unless the authorization is terminated  or revoked sooner.       Influenza A by PCR NEGATIVE NEGATIVE Final   Influenza B by PCR NEGATIVE NEGATIVE Final    Comment: (NOTE) The Xpert Xpress SARS-CoV-2/FLU/RSV plus assay is intended as an aid in the diagnosis of influenza from Nasopharyngeal swab specimens and should not be used as a sole basis for treatment. Nasal washings and aspirates are unacceptable for Xpert Xpress SARS-CoV-2/FLU/RSV testing.  Fact Sheet for Patients: BloggerCourse.com  Fact Sheet for Healthcare Providers: SeriousBroker.it  This test is not yet approved or cleared by the [place] FDA and has been authorized for detection and/or diagnosis of SARS-CoV-2 by FDA under an Emergency Use Authorization (EUA). This EUA will remain in effect (meaning this test can be used) for the duration of the COVID-19 declaration under Section 564(b)(1) of the Act, 21 U.S.C. section 360bbb-3(b)(1), unless the authorization is terminated or revoked.  Performed at [facility], [address], [place], [place] [address]     Today   Subjective    Jo Knapp today has no new complaints -Ambulating around the room without dyspnea or chest pains or palpitations or dizziness  Patient has been seen and examined prior to discharge   Objective   Blood pressure (!) 159/112, pulse 81, temperature 98 F (36.7 C), temperature source Oral, resp. rate 18, height 5\' 6"  (1.676 m), weight 95.3 kg, SpO2 97 %.   Intake/Output Summary (Last 24 hours) at 02/22/2021 1250 Last data filed at 02/21/2021 1800 Gross per 24  hour  Intake 240 ml  Output --  Net 240 ml    Exam Gen:- Awake Alert, no acute distress , speaking in complete sentences HEENT:- [place].AT, No sclera icterus Neck-Supple Neck,No JVD,.  Lungs-  CTAB , good air movement bilaterally  CV- S1, S2 normal, regular Abd-  +ve B.Sounds, Abd Soft, No tenderness,    Extremity/Skin:- No  edema,   good pulses Psych-affect is appropriate, oriented x3 Neuro-no new focal deficits, no tremors      Data Review   CBC w Diff:  Lab Results  Component Value Date   WBC 7.1 02/20/2021   HGB 12.4 02/20/2021   HGB 13.8 05/07/2014   HCT 39.0 02/20/2021   HCT 42.6 05/07/2014   PLT 260 02/20/2021   PLT 269 05/07/2014   LYMPHOPCT 19 04/12/2020   LYMPHOPCT 30.9 05/07/2014   MONOPCT 9 04/12/2020   MONOPCT 7.4 05/07/2014   EOSPCT 0 04/12/2020   EOSPCT 0.4 05/07/2014   BASOPCT 0 04/12/2020   BASOPCT 0.7 05/07/2014    CMP:  Lab Results  Component Value Date   NA 139 02/21/2021   NA 140 05/07/2014   K 3.6 02/21/2021   K 3.4 (L) 05/07/2014   CL 102 02/21/2021   CL 99 05/07/2014   CO2 29 02/21/2021   CO2 33 (H) 05/07/2014   BUN 17 02/21/2021   BUN 18 05/07/2014   CREATININE 0.85 02/21/2021   CREATININE 0.92 05/07/2014   PROT 6.3 (L) 02/21/2021   ALBUMIN 3.4 (L) 02/21/2021   BILITOT 0.8 02/21/2021   ALKPHOS 108 02/21/2021   AST 166 (H) 02/21/2021   ALT 321 (H) 02/21/2021  .   Total Discharge time is about 33 minutes  Shon Hale M.D on 02/22/2021 at 12:50 PM  Go to [email or web address] -  for contact info  [facility] - Office  [phone]

## 2021-03-13 ENCOUNTER — Encounter: Payer: Self-pay | Admitting: Student

## 2021-03-13 ENCOUNTER — Ambulatory Visit (INDEPENDENT_AMBULATORY_CARE_PROVIDER_SITE_OTHER): Payer: 59 | Admitting: Student

## 2021-03-13 VITALS — BP 150/98 | HR 64 | Ht 66.0 in | Wt 211.0 lb

## 2021-03-13 DIAGNOSIS — I10 Essential (primary) hypertension: Secondary | ICD-10-CM | POA: Diagnosis not present

## 2021-03-13 DIAGNOSIS — I5020 Unspecified systolic (congestive) heart failure: Secondary | ICD-10-CM

## 2021-03-13 DIAGNOSIS — Z86711 Personal history of pulmonary embolism: Secondary | ICD-10-CM | POA: Diagnosis not present

## 2021-03-13 DIAGNOSIS — I480 Paroxysmal atrial fibrillation: Secondary | ICD-10-CM

## 2021-03-13 MED ORDER — DAPAGLIFLOZIN PROPANEDIOL 10 MG PO TABS: 10.0000 mg | ORAL_TABLET | Freq: Every day | ORAL | 6 refills | Status: DC

## 2021-03-13 MED ORDER — METOPROLOL SUCCINATE ER 50 MG PO TB24: 50.0000 mg | ORAL_TABLET | Freq: Two times a day (BID) | ORAL | 6 refills | Status: DC

## 2021-03-13 NOTE — Progress Notes (Signed)
Cardiology Office Note    Date:  03/13/2021   ID:  Jo Knapp, DOB 08/17/1960, MRN [id number]  PCP:  Oneal Grout, FNP  Cardiologist: Evaluated by [place] MD during recent admission --> Will need to establish with MD in [place]  Chief Complaint  Patient presents with   Hospitalization Follow-up    History of Present Illness:    Jo Knapp is a 60 y.o. female with past medical history of prior PE (diagnosed in 04/2020 in the setting of COVID-19 PNA), HTN, newly diagnosed atrial fibrillation (in 02/2021 and spontaneously converted back to NSR) and newly diagnosed cardiomyopathy (EF 25-30% by echo in 02/2021) who presents to the office today for hospital follow-up.  She presented to [facility] ED in 02/2021 for evaluation of worsening dyspnea, nausea and vomiting for the past several days. She was found to be in hypertensive urgency upon arrival and also to be in new-onset atrial fibrillation with RVR. She did convert back to normal sinus rhythm while in the ED and maintained normal sinus rhythm during admission. CT Abdomen during admission did show infarcts along the inferior spleen. She was started on Toprol-XL for rate control along with Eliquis for anticoagulation. An echocardiogram was obtained and showed her EF was reduced at 25 to 30% and was felt to possibly be tachycardia mediated in the setting of her atrial fibrillation with RVR or due to uncontrolled HTN. She was started on Farxiga 5 mg daily (should be 10mg  daily for CHF), Toprol-XL 50mg  daily, Entresto 49-51mg  BID and Spironolactone 12.5mg  daily prior to discharge with plans for repeat echocardiogram in several months for reassessment of her EF following titration of GDMT and ischemic evaluation at that time if her EF remained reduced.   In talking with the patient today, she reports her breathing has overall been stable and denies any recent chest pain. She continues to experience occasional palpitations but reports  they have significantly improved since her hospitalization. She denies any specific chest pain, orthopnea or lower extremity edema but does describe two episodes of acute dyspnea at night which seem most concerning for PND. She reports good compliance with her current medication regimen and reports her pharmacist was able to obtain $10 co-pay cards for her Marcelline Deist, Eliquis and Entresto. She has overall been tolerating her medications well and denies any reported side effects.  Past Medical History:  Diagnosis Date   HFrEF (heart failure with reduced ejection fraction) (HCC)    a. EF 25-30% by echo in 02/2021   Hypertension    LV dysfunction    Paroxysmal A-fib (HCC)    a. diagnosed in 02/2021    Past Surgical History:  Procedure Laterality Date   ANKLE SURGERY     CESAREAN SECTION WITH BILATERAL TUBAL LIGATION      Current Medications: Outpatient Medications Prior to Visit  Medication Sig Dispense Refill   acetaminophen (TYLENOL) 325 MG tablet Take 2 tablets (650 mg total) by mouth every 6 (six) hours as needed for mild pain (or Fever >/= 101). 12 tablet 0   apixaban (ELIQUIS) 5 MG TABS tablet Take 1 tablet (5 mg total) by mouth 2 (two) times daily. Start around 05/12/20 after completing initial started pack 60 tablet 5   ascorbic acid (VITAMIN C) 500 MG tablet Take 1 tablet (500 mg total) by mouth daily. 30 tablet 5   omeprazole (PRILOSEC) 20 MG capsule Take 1 capsule (20 mg total) by mouth daily. 30 capsule 3   potassium chloride (KLOR-CON) 10 MEQ  tablet Take 1 tablet (10 mEq total) by mouth daily. Take While taking Lasix/furosemide 30 tablet 2   sacubitril-valsartan (ENTRESTO) 49-51 MG Take 1 tablet by mouth 2 (two) times daily. For Heart Failure 60 tablet 5   senna (SENOKOT) 8.6 MG TABS tablet Take 1 tablet (8.6 mg total) by mouth daily as needed for mild constipation. 120 tablet 0   spironolactone (ALDACTONE) 25 MG tablet Take 0.5 tablets (12.5 mg total) by mouth daily. 30 tablet 5    zinc sulfate 220 (50 Zn) MG capsule Take 1 capsule (220 mg total) by mouth daily. 30 capsule 3   amLODipine (NORVASC) 10 MG tablet Take 10 mg by mouth daily.     dapagliflozin propanediol (FARXIGA) 5 MG TABS tablet Take 1 tablet (5 mg total) by mouth daily before breakfast. For Heart 30 tablet 5   metoprolol succinate (TOPROL-XL) 50 MG 24 hr tablet Take 1 tablet (50 mg total) by mouth daily. Take with or immediately following a meal. 30 tablet 11   carvedilol (COREG) 12.5 MG tablet Take 12.5 mg by mouth 2 (two) times daily. (Patient not taking: Reported on 03/13/2021)     No facility-administered medications prior to visit.     Allergies:   Patient has no known allergies.   Social History   Socioeconomic History   Marital status: Single    Spouse name: Not on file   Number of children: Not on file   Years of education: Not on file   Highest education level: Not on file  Occupational History   Not on file  Tobacco Use   Smoking status: Never   Smokeless tobacco: Never  Vaping Use   Vaping Use: Never used  Substance and Sexual Activity   Alcohol use: Yes   Drug use: Not Currently   Sexual activity: Not on file  Other Topics Concern   Not on file  Social History Narrative   Not on file   Social Determinants of Health   Financial Resource Strain: Not on file  Food Insecurity: Not on file  Transportation Needs: Not on file  Physical Activity: Not on file  Stress: Not on file  Social Connections: Not on file     Family History:  The patient's family history includes Heart disease in her mother. She was adopted.   Review of Systems:    Please see the history of present illness.     All other systems reviewed and are otherwise negative except as noted above.   Physical Exam:    VS:  BP (!) 150/98   Pulse 64   Ht 5\' 6"  (1.676 m)   Wt 211 lb (95.7 kg)   BMI 34.06 kg/m    General: Well developed, well nourished,female appearing in no acute distress. Head:  Normocephalic, atraumatic. Neck: No carotid bruits. JVD not elevated.  Lungs: Respirations regular and unlabored, without wheezes or rales.  Heart: Regular rate and rhythm. No S3 or S4.  No murmur, no rubs, or gallops appreciated. Abdomen: Appears non-distended. No obvious abdominal masses. Msk:  Strength and tone appear normal for age. No obvious joint deformities or effusions. Extremities: No clubbing or cyanosis. No pitting edema.  Distal pedal pulses are 2+ bilaterally. Neuro: Alert and oriented X 3. Moves all extremities spontaneously. No focal deficits noted. Psych:  Responds to questions appropriately with a normal affect. Skin: No rashes or lesions noted  Wt Readings from Last 3 Encounters:  03/13/21 211 lb (95.7 kg)  02/18/21 210 lb (95.3 kg)  04/10/20 205 lb 0.4 oz (93 kg)     Studies/Labs Reviewed:   EKG:  EKG is not ordered today.    Recent Labs: 02/18/2021: TSH 2.122 02/20/2021: Hemoglobin 12.4; Platelets 260 02/21/2021: ALT 321; BUN 17; Creatinine, Ser 0.85; Magnesium 1.9; Potassium 3.6; Sodium 139   Lipid Panel    Component Value Date/Time   TRIG 78 04/09/2020 0427    Additional studies/ records that were reviewed today include:   Echocardiogram: 02/19/2021 IMPRESSIONS     1. Diffuse hypokinesis, worse in the inferior and inferoseptal walls. .  Left ventricular ejection fraction, by estimation, is 25 to 30%. The left  ventricle has severely decreased function. The left ventricular internal  cavity size was mildly dilated.  There is mild left ventricular hypertrophy. Left ventricular diastolic  parameters are consistent with Grade II diastolic dysfunction  (pseudonormalization).   2. Right ventricular systolic function is moderately reduced. The right  ventricular size is normal. There is mildly elevated pulmonary artery  systolic pressure.   3. Left atrial size was mild to moderately dilated.   4. Right atrial size was mild to moderately dilated.   5. The  mitral valve is normal in structure. Mild mitral valve  regurgitation.   6. The aortic valve is tricuspid. Aortic valve regurgitation is trivial.  Mild to moderate aortic valve sclerosis/calcification is present, without  any evidence of aortic stenosis.   7. There is mild dilatation of the ascending aorta, measuring 40 mm.   8. The inferior vena cava is dilated in size with <50% respiratory  variability, suggesting right atrial pressure of 15 mmHg.   Assessment:    1. HFrEF (heart failure with reduced ejection fraction) (HCC)   2. PAF (paroxysmal atrial fibrillation) (HCC)   3. Essential hypertension   4. History of pulmonary embolus (PE)      Plan:   In order of problems listed above:  1. HFrEF - Recent echo showed her EF was reduced at 25-30% which was felt to possibly be tachycardia-mediated in the setting of atrial fibrillation or due to uncontrolled HTN.  - She reports what sounds like occasional episodes of PND but reports overall significant improvement in her dyspnea since hospital discharge.  - She is currently on Toprol-XL 50mg  daily, Farxiga 5mg  daily, Entresto 49-51mg  BID and Spironolactone 12.5mg  daily. Will titrate Farxiga to 10mg  daily which is the appropriate dose for cardiomyopathy. She did have labs with her PCP following hospital discharge and I will request a copy. Will also titrate Toprol-XL to 50mg  BID given her palpitations and elevated BP. If BP remains elevated, would plan to titrate Entresto or Spironolactone. Will plan for a repeat echocardiogram in 2-3 months once GDMT has been optimized. We did review she would require ischemic evaluation in the future with a catheterization if EF remains reduced.   2. Paroxysmal Atrial Fibrillation - She does continue to have intermittent palpitations but reports her symptoms have improved. Will titrate Toprol-XL from 50mg  daily to 50mg  BID.  - She denies any evidence of active bleeding. Remains on Eliquis 5mg  BID for  anticoagulation.   3. HTN - Her BP is elevated at 150/98 during today's visit. Will titrate Toprol-XL initially and can titrate Entresto or Spironolactone in the future. Will request recent labs from her PCP.   4. 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 She remains on Eliquis for anticoagulation.  Medication Adjustments/Labs and Tests Ordered: Current medicines are reviewed at length with the patient today.  Concerns regarding medicines are outlined above.  Medication changes, Labs and Tests ordered today are listed in the Patient Instructions below. Patient Instructions  Medication Instructions:   INCREASE Farxiga to 10 mg daily  INCREASE Metoprolol XL to 50 mg twice a day   *If you need a refill on your cardiac medications before your next appointment, please call your pharmacy*   Lab Work: None today   If you have labs (blood work) drawn today and your tests are completely normal, you will receive your results only by: MyChart Message (if you have MyChart) OR A paper copy in the mail If you have any lab test that is abnormal or we need to change your treatment, we will call you to review the results.   Testing/Procedures: None today    Follow-Up: At [facility], you and your health needs are our priority.  As part of our continuing mission to provide you with exceptional heart care, we have created designated Provider Care Teams.  These Care Teams include your primary Cardiologist (physician) and Advanced Practice Providers (APPs -  Physician Assistants and Nurse Practitioners) who all work together to provide you with the care you need, when you need it.  We recommend signing up for the patient portal called "MyChart".  Sign up information is provided on this After Visit Summary.  MyChart is used to connect with patients for Virtual Visits (Telemedicine).   Patients are able to view lab/test results, encounter notes, upcoming appointments, etc.  Non-urgent messages can be sent to your provider as well.   To learn more about what you can do with MyChart, go to [email or web address].    Your next appointment:   1 month(s)  The format for your next appointment:   In Person  Provider:   Randall An, Cordelia Poche or M.D.   Other Instructions None     Signed, Ellsworth Lennox, PA-C  03/13/2021 7:53 PM     [facility] [address] [place], [place] [address] Phone: [phone] Fax: [phone]

## 2021-03-13 NOTE — Patient Instructions (Signed)
Medication Instructions:   INCREASE Farxiga to 10 mg daily  INCREASE Metoprolol XL to 50 mg twice a day   *If you need a refill on your cardiac medications before your next appointment, please call your pharmacy*   Lab Work: None today   If you have labs (blood work) drawn today and your tests are completely normal, you will receive your results only by: MyChart Message (if you have MyChart) OR A paper copy in the mail If you have any lab test that is abnormal or we need to change your treatment, we will call you to review the results.   Testing/Procedures: None today    Follow-Up: At [facility], you and your health needs are our priority.  As part of our continuing mission to provide you with exceptional heart care, we have created designated Provider Care Teams.  These Care Teams include your primary Cardiologist (physician) and Advanced Practice Providers (APPs -  Physician Assistants and Nurse Practitioners) who all work together to provide you with the care you need, when you need it.  We recommend signing up for the patient portal called "MyChart".  Sign up information is provided on this After Visit Summary.  MyChart is used to connect with patients for Virtual Visits (Telemedicine).  Patients are able to view lab/test results, encounter notes, upcoming appointments, etc.  Non-urgent messages can be sent to your provider as well.   To learn more about what you can do with MyChart, go to [email or web address].    Your next appointment:   1 month(s)  The format for your next appointment:   In Person  Provider:   Randall An, Cordelia Poche or M.D.   Other Instructions None

## 2021-04-14 ENCOUNTER — Ambulatory Visit: Payer: 59 | Admitting: Cardiology

## 2021-05-09 ENCOUNTER — Encounter: Payer: Self-pay | Admitting: Cardiology

## 2021-05-09 ENCOUNTER — Other Ambulatory Visit: Payer: Self-pay

## 2021-05-09 ENCOUNTER — Ambulatory Visit (INDEPENDENT_AMBULATORY_CARE_PROVIDER_SITE_OTHER): Payer: 59 | Admitting: Cardiology

## 2021-05-09 VITALS — BP 158/98 | HR 80 | Ht 66.0 in | Wt 210.0 lb

## 2021-05-09 DIAGNOSIS — I10 Essential (primary) hypertension: Secondary | ICD-10-CM | POA: Diagnosis not present

## 2021-05-09 DIAGNOSIS — I480 Paroxysmal atrial fibrillation: Secondary | ICD-10-CM

## 2021-05-09 DIAGNOSIS — I5020 Unspecified systolic (congestive) heart failure: Secondary | ICD-10-CM

## 2021-05-09 DIAGNOSIS — Z86711 Personal history of pulmonary embolism: Secondary | ICD-10-CM

## 2021-05-09 MED ORDER — ENTRESTO 97-103 MG PO TABS: 1.0000 | ORAL_TABLET | Freq: Two times a day (BID) | ORAL | 3 refills | Status: DC

## 2021-05-09 MED ORDER — METOPROLOL SUCCINATE ER 50 MG PO TB24: 50.0000 mg | ORAL_TABLET | Freq: Two times a day (BID) | ORAL | 6 refills | Status: DC

## 2021-05-09 MED ORDER — METOPROLOL TARTRATE 100 MG PO TABS: 100.0000 mg | ORAL_TABLET | Freq: Once | ORAL | 0 refills | Status: DC

## 2021-05-09 NOTE — Addendum Note (Signed)
Addended by: Marlyn Corporal A on: 05/09/2021 04:38 PM   Modules accepted: Orders

## 2021-05-09 NOTE — Patient Instructions (Addendum)
Medication Instructions:  Your physician has recommended you make the following change in your medication: 1) INCREASE Entresto 97-103 twice daily   *If you need a refill on your cardiac medications before your next appointment, please call your pharmacy*   Lab Work: TODAY: BMET IN ONE WEEK: BMET If you have labs (blood work) drawn today and your tests are completely normal, you will receive your results only by: MyChart Message (if you have MyChart) OR A paper copy in the mail If you have any lab test that is abnormal or we need to change your treatment, we will call you to review the results.   Testing/Procedures: Your physician has requested that you have an echocardiogram. Echocardiography is a painless test that uses sound waves to create images of your heart. It provides your doctor with information about the size and shape of your heart and how well your heart's chambers and valves are working. This procedure takes approximately one hour. There are no restrictions for this procedure.  Your physician has requested that you have a coronary CTA scan. Please see below for further instructions.    Follow-Up: At [facility], you and your health needs are our priority.  As part of our continuing mission to provide you with exceptional heart care, we have created designated Provider Care Teams.  These Care Teams include your primary Cardiologist (physician) and Advanced Practice Providers (APPs -  Physician Assistants and Nurse Practitioners) who all work together to provide you with the care you need, when you need it.  Your next appointment:   3 month(s)  The format for your next appointment:   In Person  Provider:   You may see Armanda Magic or one of the following Advanced Practice Providers on your designated Care Team:   Randall An, PA-C  Jacolyn Reedy, [place]    Other Instructions   Your cardiac CT will be scheduled at:   [facility] [address] [place], [place] [address] [phone]  Please arrive at the [facility] main entrance (entrance A) of [facility] 30 minutes prior to test start time. You can use the FREE valet parking offered at the main entrance (encouraged to control the heart rate for the test) Proceed to the [facility] Radiology Department (first floor) to check-in and test prep.   Please follow these instructions carefully (unless otherwise directed):  On the Night Before the Test: Be sure to Drink plenty of water. Do not consume any caffeinated/decaffeinated beverages or chocolate 12 hours prior to your test. Do not take any antihistamines 12 hours prior to your test.   On the Day of the Test: Drink plenty of water until 1 hour prior to the test. Do not eat any food 4 hours prior to the test. You may take your regular medications prior to the test.  Take metoprolol (Lopressor) two hours prior to test. Hold Toprol the morning of the test.  FEMALES- please wear underwire-free bra if available, avoid dresses & tight clothing      After the Test: Drink plenty of water. After receiving IV contrast, you may experience a mild flushed feeling. This is normal. On occasion, you may experience a mild rash up to 24 hours after the test. This is not dangerous. If this occurs, you can take Benadryl 25 mg and increase your fluid intake. If you experience trouble breathing, this can be serious. If it is severe call 911 IMMEDIATELY. If it is mild, please call our office.  If you take any of these medications: Glipizide/Metformin, Avandament, Glucavance, please do not take 48 hours after completing test unless otherwise instructed.  Please allow 2-4 weeks for scheduling of routine cardiac CTs. Some insurance companies require a pre-authorization which may delay scheduling of this test.   For non-scheduling related questions, please contact the cardiac imaging nurse navigator should you have any  questions/concerns: Rockwell Alexandria, Cardiac Imaging Nurse Navigator Larey Brick, Cardiac Imaging Nurse Navigator [facility] Direct Office Dial: [phone]   For scheduling needs, including cancellations and rescheduling, please call [place], [phone].

## 2021-05-09 NOTE — Progress Notes (Signed)
Cardiology Office Note    Date:  05/09/2021   ID:  Jo Knapp, DOB August 28, 1960, MRN [id number]  PCP:  Oneal Grout, FNP  Cardiologist: Evaluated by [place] MD during recent admission --> Will need to establish with MD in [place]  Chief Complaint  Patient presents with   Congestive Heart Failure   Hypertension   Atrial Fibrillation     History of Present Illness:    Jo Knapp is a 60 y.o. female with past medical history of prior PE (diagnosed in 04/2020 in the setting of COVID-19 PNA), HTN, newly diagnosed atrial fibrillation (in 02/2021 and spontaneously converted back to NSR) and newly diagnosed cardiomyopathy (EF 25-30% by echo in 02/2021) felt tachycardia mediated currently on GDMT which is being titrated.  She [facility] to [facility] ED in 02/2021 for evaluation of worsening dyspnea, nausea and vomiting for the past several days. She was found to be in hypertensive urgency upon arrival and also to be in new-onset atrial fibrillation with RVR. She did convert back to normal sinus rhythm while in the ED and maintained normal sinus rhythm during admission. CT Abdomen during admission did show infarcts along the inferior spleen. She was started on Toprol-XL for rate control along with Eliquis for anticoagulation. An echocardiogram was obtained and showed her EF was reduced at 25 to 30% and was felt to possibly be tachycardia mediated in the setting of her atrial fibrillation with RVR or due to uncontrolled HTN. She was started on Farxiga 5 mg daily (should be 10mg  daily for CHF), Toprol-XL 50mg  daily, Entresto 49-51mg  BID and Spironolactone 12.5mg  daily prior to discharge with plans for repeat echocardiogram in several months for reassessment of her EF following titration of GDMT and ischemic evaluation at that time if her EF remained reduced.   At last office visit her was increased to 10 mg daily and Toprol-XL was increased to 50 mg twice daily for palpitations.  She is here today for followup.  Since her last OV she is now having problems with chest pain and SOB that has been occurring off and on for a while.  She says that she will wake up from sleep with chest pain and cannot catch her breath. Her chest will feel heavy and tight with no radiation.  If she sits up it gets better.  She also gets this during the day when she is active walking and lifting things and resolves with rest.  She denies any LE edema, dizziness or syncope. She has had a few palpitations but only feels fast and not irregular. She is compliant with her meds and is tolerating meds with no SE.     Past Medical History:  Diagnosis Date   HFrEF (heart failure with reduced ejection fraction) (HCC)    a. EF 25-30% by echo in 02/2021   Hypertension    LV dysfunction    Paroxysmal A-fib (HCC)    a. diagnosed in 02/2021    Past Surgical History:  Procedure Laterality Date   ANKLE SURGERY     CESAREAN SECTION WITH BILATERAL TUBAL LIGATION      Current Medications: Outpatient Medications Prior to Visit  Medication Sig Dispense Refill   acetaminophen (TYLENOL) 325 MG tablet Take 2 tablets (650 mg total) by mouth every 6 (six) hours as needed for mild pain (or Fever >/= 101). 12 tablet 0   apixaban (ELIQUIS) 5 MG TABS tablet Take 1 tablet (5 mg total) by mouth 2 (two) times daily. Start around  05/12/20 after completing initial started pack 60 tablet 5   ascorbic acid (VITAMIN C) 500 MG tablet Take 1 tablet (500 mg total) by mouth daily. 30 tablet 5   dapagliflozin propanediol (FARXIGA) 10 MG TABS tablet Take 1 tablet (10 mg total) by mouth daily before breakfast. For Heart 30 tablet 6   omeprazole (PRILOSEC) 20 MG capsule Take 1 capsule (20 mg total) by mouth daily. 30 capsule 3   potassium chloride (KLOR-CON) 10 MEQ tablet Take 1 tablet (10 mEq total) by mouth daily. Take While taking Lasix/furosemide 30 tablet 2   sacubitril-valsartan (ENTRESTO) 49-51 MG Take 1 tablet by mouth 2 (two)  times daily. For Heart Failure 60 tablet 5   senna (SENOKOT) 8.6 MG TABS tablet Take 1 tablet (8.6 mg total) by mouth daily as needed for mild constipation. 120 tablet 0   spironolactone (ALDACTONE) 25 MG tablet Take 0.5 tablets (12.5 mg total) by mouth daily. 30 tablet 5   zinc sulfate 220 (50 Zn) MG capsule Take 1 capsule (220 mg total) by mouth daily. 30 capsule 3   metoprolol succinate (TOPROL-XL) 100 MG 24 hr tablet Take 100 mg by mouth daily. Take with or immediately following a meal.     metoprolol succinate (TOPROL-XL) 50 MG 24 hr tablet Take 1 tablet (50 mg total) by mouth 2 (two) times daily. Take with or immediately following a meal. 60 tablet 6   No facility-administered medications prior to visit.     Allergies:   Patient has no known allergies.   Social History   Socioeconomic History   Marital status: Single    Spouse name: Not on file   Number of children: Not on file   Years of education: Not on file   Highest education level: Not on file  Occupational History   Not on file  Tobacco Use   Smoking status: Never   Smokeless tobacco: Never  Vaping Use   Vaping Use: Never used  Substance and Sexual Activity   Alcohol use: Yes   Drug use: Not Currently   Sexual activity: Not on file  Other Topics Concern   Not on file  Social History Narrative   Not on file   Social Determinants of Health   Financial Resource Strain: Not on file  Food Insecurity: Not on file  Transportation Needs: Not on file  Physical Activity: Not on file  Stress: Not on file  Social Connections: Not on file     Family History:  The patient's family history includes Heart disease in her mother. She was adopted.   Review of Systems:    Please see the history of present illness.     All other systems reviewed and are otherwise negative except as noted above.   Physical Exam:    VS:  BP (!) 158/98   Pulse 80   Ht 5\' 6"  (1.676 m)   Wt 210 lb (95.3 kg)   SpO2 97%   BMI 33.89  kg/m    GEN: Well nourished, well developed in no acute distress HEENT: Normal NECK: No JVD; No carotid bruits LYMPHATICS: No lymphadenopathy CARDIAC:RRR, no murmurs, rubs, gallops RESPIRATORY:  Clear to auscultation without rales, wheezing or rhonchi  ABDOMEN: Soft, non-tender, non-distended MUSCULOSKELETAL:  No edema; No deformity  SKIN: Warm and dry NEUROLOGIC:  Alert and oriented x 3 PSYCHIATRIC:  Normal affect   Wt Readings from Last 3 Encounters:  05/09/21 210 lb (95.3 kg)  03/13/21 211 lb (95.7 kg)  02/18/21 210  lb (95.3 kg)     Studies/Labs Reviewed:   EKG:  EKG is ordered today and showed NSR with LVH and LAD, diffue T wave abnormality in the lateral leads  Recent Labs: 02/18/2021: TSH 2.122 02/20/2021: Hemoglobin 12.4; Platelets 260 02/21/2021: ALT 321; BUN 17; Creatinine, Ser 0.85; Magnesium 1.9; Potassium 3.6; Sodium 139   Lipid Panel    Component Value Date/Time   TRIG 78 04/09/2020 0427    Additional studies/ records that were reviewed today include:   Echocardiogram: 02/19/2021 IMPRESSIONS     1. Diffuse hypokinesis, worse in the inferior and inferoseptal walls. .  Left ventricular ejection fraction, by estimation, is 25 to 30%. The left  ventricle has severely decreased function. The left ventricular internal  cavity size was mildly dilated.  There is mild left ventricular hypertrophy. Left ventricular diastolic  parameters are consistent with Grade II diastolic dysfunction  (pseudonormalization).   2. Right ventricular systolic function is moderately reduced. The right  ventricular size is normal. There is mildly elevated pulmonary artery  systolic pressure.   3. Left atrial size was mild to moderately dilated.   4. Right atrial size was mild to moderately dilated.   5. The mitral valve is normal in structure. Mild mitral valve  regurgitation.   6. The aortic valve is tricuspid. Aortic valve regurgitation is trivial.  Mild to moderate aortic valve  sclerosis/calcification is present, without  any evidence of aortic stenosis.   7. There is mild dilatation of the ascending aorta, measuring 40 mm.   8. The inferior vena cava is dilated in size with <50% respiratory  variability, suggesting right atrial pressure of 15 mmHg.   Assessment:    1. HFrEF (heart failure with reduced ejection fraction) (HCC)   2. PAF (paroxysmal atrial fibrillation) (HCC)   3. Essential hypertension   4. History of pulmonary embolus (PE)     Plan:   In order of problems listed above:  1. HFrEF - Recent echo showed her EF was reduced at 25-30% which was felt to possibly be tachycardia-mediated in the setting of atrial fibrillation or due to uncontrolled HTN.  - She is currently on Toprol-XL 50mg  twice daily, Farxiga 10mg  daily, Entresto 49-51mg  BID and Spironolactone 12.5mg  daily.  -We did review she would require ischemic evaluation in the future with a catheterization if EF remains reduced.   2. Paroxysmal Atrial Fibrillation -Last office visit Toprol was increased to 50 mg twice daily and she has now had significant improvement in her palpitations -Continue Toprol-XL 50 mg twice daily with as needed refills as well as Eliquis 5 mg twice daily  -She denies any bleeding problems on DOAC.    -Check bmet in CBC today  3. HTN - Her BP remains elevated today despite increasing Toprol  -Continue Toprol-XL 50 mg BID -Increase Entresto to 97-103 mg twice daily and continue Spiro 12.5 mg daily as well as Farxiga 10 mg daily -Repeat bmet in 1 week -Repeat 2D echocardiogram in 2 months she will have been on maximum medical therapy at that time  4. 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  -She remains on Eliquis for anticoagulation.    Medication Adjustments/Labs and Tests Ordered: Current medicines are reviewed at length with the patient  today.  Concerns regarding medicines are outlined above.  Medication changes, Labs and Tests ordered today are listed in the Patient Instructions  below. There are no Patient Instructions on file for this visit.   Signed, Armanda Magic, MD  05/09/2021 3:28 PM    [facility] [address] [place], [place] [address] Phone: [phone] Fax: [phone]

## 2021-05-09 NOTE — Addendum Note (Signed)
Addended by: Theresia Majors on: 05/09/2021 03:57 PM   Modules accepted: Orders

## 2021-05-27 ENCOUNTER — Telehealth (HOSPITAL_COMMUNITY): Payer: Self-pay | Admitting: Emergency Medicine

## 2021-05-27 NOTE — Telephone Encounter (Signed)
Attempted to call patient regarding upcoming cardiac CT appointment. Left message on voicemail with name and callback number Krystall Kruckenberg RN Navigator Cardiac Imaging [facility] [phone] Office [phone] Cell  Reminded to get labs 

## 2021-05-28 ENCOUNTER — Telehealth (HOSPITAL_COMMUNITY): Payer: Self-pay | Admitting: Emergency Medicine

## 2021-05-28 NOTE — Telephone Encounter (Signed)
Attempted to call patient regarding upcoming cardiac CT appointment. °Left message on voicemail with name and callback number °Keino Placencia RN Navigator Cardiac Imaging °[facility] °[phone] Office °[phone] Cell ° °

## 2021-05-29 ENCOUNTER — Ambulatory Visit (HOSPITAL_COMMUNITY): Admission: RE | Admit: 2021-05-29 | Payer: No Typology Code available for payment source | Source: Ambulatory Visit

## 2021-06-09 ENCOUNTER — Telehealth (HOSPITAL_COMMUNITY): Payer: Self-pay | Admitting: Emergency Medicine

## 2021-06-09 NOTE — Telephone Encounter (Signed)
Attempted to call patient regarding upcoming cardiac CT appointment. Left message on voicemail with name and callback number Rockwell Alexandria RN Navigator Cardiac Imaging [facility] [phone] Office [phone] Cell  Reminded to get labs - [facility] or [facility] [place]

## 2021-06-11 ENCOUNTER — Ambulatory Visit (HOSPITAL_COMMUNITY): Admission: RE | Admit: 2021-06-11 | Payer: 59 | Source: Ambulatory Visit

## 2021-06-19 ENCOUNTER — Ambulatory Visit (HOSPITAL_COMMUNITY): Admission: RE | Admit: 2021-06-19 | Payer: 59 | Source: Ambulatory Visit

## 2021-08-01 ENCOUNTER — Encounter (HOSPITAL_COMMUNITY): Payer: Self-pay

## 2021-08-18 ENCOUNTER — Telehealth (HOSPITAL_COMMUNITY): Payer: Self-pay | Admitting: *Deleted

## 2021-08-18 NOTE — Telephone Encounter (Signed)
Attempted to call patient regarding upcoming cardiac CT appointment and to remind patient to obtain blood work prior to appointment. Left message on voicemail with name and callback number  Enrico Eaddy RN Navigator Cardiac Imaging [facility] [phone] Office [phone] Cell    

## 2021-08-19 ENCOUNTER — Telehealth (HOSPITAL_COMMUNITY): Payer: Self-pay | Admitting: *Deleted

## 2021-08-19 NOTE — Telephone Encounter (Signed)
Second attempt to call patient regarding upcoming cardiac CT appointment. Left message on voicemail with name and callback number  Janiylah Hannis RN Navigator Cardiac Imaging [facility] [phone] Office [phone] Cell  

## 2021-08-20 ENCOUNTER — Other Ambulatory Visit: Payer: Self-pay

## 2021-08-20 ENCOUNTER — Other Ambulatory Visit (HOSPITAL_COMMUNITY)
Admission: RE | Admit: 2021-08-20 | Discharge: 2021-08-20 | Disposition: A | Discharge status: Home / Self Care | Payer: 59 | Source: Ambulatory Visit | Attending: Cardiology | Admitting: Cardiology

## 2021-08-20 ENCOUNTER — Ambulatory Visit (HOSPITAL_COMMUNITY): Admission: RE | Admit: 2021-08-20 | Payer: 59 | Source: Ambulatory Visit

## 2021-08-20 DIAGNOSIS — Z86711 Personal history of pulmonary embolism: Secondary | ICD-10-CM | POA: Diagnosis present

## 2021-08-20 DIAGNOSIS — I480 Paroxysmal atrial fibrillation: Secondary | ICD-10-CM | POA: Diagnosis present

## 2021-08-20 DIAGNOSIS — I5020 Unspecified systolic (congestive) heart failure: Secondary | ICD-10-CM

## 2021-08-20 DIAGNOSIS — I10 Essential (primary) hypertension: Secondary | ICD-10-CM

## 2021-08-20 LAB — BASIC METABOLIC PANEL
Anion gap: 6 (ref 5–15)
BUN: 20 mg/dL (ref 6–20)
CO2: 29 mmol/L (ref 22–32)
Calcium: 9.1 mg/dL (ref 8.9–10.3)
Chloride: 103 mmol/L (ref 98–111)
Creatinine, Ser: 0.98 mg/dL (ref 0.44–1.00)
GFR, Estimated: >60 mL/min (ref >60)
Glucose, Bld: 95 mg/dL (ref 70–99)
Potassium: 4.1 mmol/L (ref 3.5–5.1)
Sodium: 138 mmol/L (ref 135–145)

## 2021-08-21 ENCOUNTER — Encounter: Payer: Self-pay | Admitting: Cardiology

## 2021-08-21 ENCOUNTER — Telehealth: Payer: Self-pay | Admitting: Cardiology

## 2021-08-21 ENCOUNTER — Ambulatory Visit (HOSPITAL_COMMUNITY)
Admission: RE | Admit: 2021-08-21 | Discharge: 2021-08-21 | Disposition: A | Discharge status: Home / Self Care | Payer: 59 | Source: Ambulatory Visit | Attending: Cardiology | Admitting: Cardiology

## 2021-08-21 DIAGNOSIS — I480 Paroxysmal atrial fibrillation: Secondary | ICD-10-CM | POA: Diagnosis present

## 2021-08-21 DIAGNOSIS — Z86711 Personal history of pulmonary embolism: Secondary | ICD-10-CM | POA: Insufficient documentation

## 2021-08-21 DIAGNOSIS — I10 Essential (primary) hypertension: Secondary | ICD-10-CM | POA: Diagnosis present

## 2021-08-21 DIAGNOSIS — I5020 Unspecified systolic (congestive) heart failure: Secondary | ICD-10-CM | POA: Insufficient documentation

## 2021-08-21 DIAGNOSIS — I77810 Thoracic aortic ectasia: Secondary | ICD-10-CM | POA: Insufficient documentation

## 2021-08-21 DIAGNOSIS — I700 Atherosclerosis of aorta: Secondary | ICD-10-CM | POA: Diagnosis not present

## 2021-08-21 LAB — POCT I-STAT CREATININE: Creatinine, Ser: 1 mg/dL (ref 0.44–1.00)

## 2021-08-21 MED ORDER — NITROGLYCERIN 0.4 MG SL SUBL
SUBLINGUAL_TABLET | SUBLINGUAL | Status: AC
  Filled 2021-08-21: qty 2

## 2021-08-21 MED ORDER — METOPROLOL TARTRATE 5 MG/5ML IV SOLN
INTRAVENOUS | Status: AC
  Filled 2021-08-21: qty 5

## 2021-08-21 MED ORDER — IOHEXOL 350 MG/ML SOLN
59.0000 mL | Freq: Once | INTRAVENOUS | Status: AC | PRN
Start: 2021-08-21 — End: 2021-08-21
  Administered 2021-08-21: 59 mL via INTRAVENOUS

## 2021-08-21 NOTE — Telephone Encounter (Signed)
Follow Up:.     Patient is returning Lydia's call  from today.

## 2021-08-21 NOTE — Telephone Encounter (Signed)
Patient informed. Copy sent to PCP °

## 2021-09-22 ENCOUNTER — Ambulatory Visit (HOSPITAL_COMMUNITY)
Admission: RE | Admit: 2021-09-22 | Discharge: 2021-09-22 | Disposition: A | Discharge status: Home / Self Care | Payer: 59 | Source: Ambulatory Visit | Attending: Cardiology | Admitting: Cardiology

## 2021-09-22 DIAGNOSIS — I420 Dilated cardiomyopathy: Secondary | ICD-10-CM | POA: Diagnosis not present

## 2021-09-22 DIAGNOSIS — I10 Essential (primary) hypertension: Secondary | ICD-10-CM | POA: Diagnosis present

## 2021-09-22 DIAGNOSIS — I480 Paroxysmal atrial fibrillation: Secondary | ICD-10-CM | POA: Insufficient documentation

## 2021-09-22 DIAGNOSIS — Z86711 Personal history of pulmonary embolism: Secondary | ICD-10-CM | POA: Insufficient documentation

## 2021-09-22 DIAGNOSIS — I5020 Unspecified systolic (congestive) heart failure: Secondary | ICD-10-CM | POA: Insufficient documentation

## 2021-09-22 LAB — ECHOCARDIOGRAM COMPLETE
AR max vel: 2.56 cm2
AV Area VTI: 1.98 cm2
AV Area mean vel: 2.11 cm2
AV Mean grad: 4 mmHg
AV Peak grad: 7 mmHg
Ao pk vel: 1.32 m/s
Area-P 1/2: 2.42 cm2
MV VTI: 2.17 cm2
S' Lateral: 5 cm
Single Plane A4C EF: 32.3 %

## 2021-09-22 NOTE — Progress Notes (Signed)
*  PRELIMINARY RESULTS* ?Echocardiogram ?2D Echocardiogram has been performed. ? ?Jo Knapp ?09/22/2021, 12:51 PM ?

## 2021-09-23 ENCOUNTER — Telehealth: Payer: Self-pay | Admitting: Cardiology

## 2021-09-23 DIAGNOSIS — I5020 Unspecified systolic (congestive) heart failure: Secondary | ICD-10-CM

## 2021-09-23 DIAGNOSIS — I480 Paroxysmal atrial fibrillation: Secondary | ICD-10-CM

## 2021-09-23 NOTE — Telephone Encounter (Signed)
-----   Message from Sueanne Margarita, MD sent at 09/22/2021  5:57 PM EDT ----- ?Echo showed moderately reduced LVF with EF 30-35%, mildly thickened heart muscle with increased stiffness called diastolic dysfunction, mild to moderately reduced RVF, enlarged atria, mildly dilated ascending aorta and aortic root - EF appears to have improved some but EF remains low on max tolerated GDMT.  Please refer to EP for consideration of AICD and also to Advanced heart failure clinic ?

## 2021-09-23 NOTE — Telephone Encounter (Signed)
° °  Pt is returning call to get echo result °

## 2021-09-23 NOTE — Telephone Encounter (Signed)
The patient has been notified of the result and verbalized understanding.  All questions (if any) were answered. ?Theresia Majors, RN 09/23/2021 1:57 PM  ?Referral have been placed.  ?

## 2021-09-24 ENCOUNTER — Ambulatory Visit: Payer: 59 | Admitting: Cardiology

## 2021-10-14 ENCOUNTER — Other Ambulatory Visit (HOSPITAL_COMMUNITY)
Admission: RE | Admit: 2021-10-14 | Discharge: 2021-10-14 | Disposition: A | Discharge status: Home / Self Care | Payer: 59 | Source: Ambulatory Visit | Attending: Internal Medicine | Admitting: Internal Medicine

## 2021-10-14 ENCOUNTER — Encounter: Payer: Self-pay | Admitting: *Deleted

## 2021-10-14 ENCOUNTER — Encounter: Payer: Self-pay | Admitting: Internal Medicine

## 2021-10-14 ENCOUNTER — Ambulatory Visit: Payer: 59 | Admitting: Internal Medicine

## 2021-10-14 VITALS — BP 158/108 | HR 77 | Ht 66.0 in | Wt 224.0 lb

## 2021-10-14 DIAGNOSIS — Z01818 Encounter for other preprocedural examination: Secondary | ICD-10-CM | POA: Insufficient documentation

## 2021-10-14 LAB — CBC
HCT: 50.7 % — ABNORMAL HIGH (ref 36.0–46.0)
Hemoglobin: 16.3 g/dL — ABNORMAL HIGH (ref 12.0–15.0)
MCH: 30.2 pg (ref 26.0–34.0)
MCHC: 32.1 g/dL (ref 30.0–36.0)
MCV: 93.9 fL (ref 80.0–100.0)
Platelets: 289 10*3/uL (ref 150–400)
RBC: 5.4 MIL/uL — ABNORMAL HIGH (ref 3.87–5.11)
RDW: 13.2 % (ref 11.5–15.5)
WBC: 8.5 10*3/uL (ref 4.0–10.5)
nRBC: 0 % (ref 0.0–0.2)

## 2021-10-14 LAB — BASIC METABOLIC PANEL
Anion gap: 8 (ref 5–15)
BUN: 15 mg/dL (ref 6–20)
CO2: 27 mmol/L (ref 22–32)
Calcium: 9.4 mg/dL (ref 8.9–10.3)
Chloride: 106 mmol/L (ref 98–111)
Creatinine, Ser: 0.82 mg/dL (ref 0.44–1.00)
GFR, Estimated: >60 mL/min (ref >60)
Glucose, Bld: 84 mg/dL (ref 70–99)
Potassium: 4.1 mmol/L (ref 3.5–5.1)
Sodium: 141 mmol/L (ref 135–145)

## 2021-10-14 NOTE — Patient Instructions (Signed)
Medication Instructions:  ?Your physician recommends that you continue on your current medications as directed. Please refer to the Current Medication list given to you today. ? ?*If you need a refill on your cardiac medications before your next appointment, please call your pharmacy* ? ? ?Lab Work: ?Your physician recommends that you return for lab work in: BMET, CBC ? ?If you have labs (blood work) drawn today and your tests are completely normal, you will receive your results only by: ?MyChart Message (if you have MyChart) OR ?A paper copy in the mail ?If you have any lab test that is abnormal or we need to change your treatment, we will call you to review the results. ? ? ?Testing/Procedures: ?Your physician has recommended that you have a defibrillator inserted. An implantable cardioverter defibrillator (ICD) is a small device that is placed in your chest or, in rare cases, your abdomen. This device uses electrical pulses or shocks to help control life-threatening, irregular heartbeats that could lead the heart to suddenly stop beating (sudden cardiac arrest). Leads are attached to the ICD that goes into your heart. This is done in the hospital and usually requires an overnight stay. Please see the instruction sheet given to you today for more information. ? ? ? ?Follow-Up: ?At [facility], you and your health needs are our priority.  As part of our continuing mission to provide you with exceptional heart care, we have created designated Provider Care Teams.  These Care Teams include your primary Cardiologist (physician) and Advanced Practice Providers (APPs -  Physician Assistants and Nurse Practitioners) who all work together to provide you with the care you need, when you need it. ? ?We recommend signing up for the patient portal called "MyChart".  Sign up information is provided on this After Visit Summary.  MyChart is used to connect with patients for Virtual Visits (Telemedicine).  Patients are able to  view lab/test results, encounter notes, upcoming appointments, etc.  Non-urgent messages can be sent to your provider as well.   ?To learn more about what you can do with MyChart, go to [email or web address].   ? ?Your next appointment:   ? 14 Day Wound Check  ? ?The format for your next appointment:   ?In Person ? ?Provider:   ?Lewayne Bunting, MD  ? ? ?Other Instructions ?Thank you for choosing [facility]! ?  ?Dates for ICD placement are April 18,19 , 24   ?

## 2021-10-14 NOTE — H&P (View-Only) (Signed)
? ? ? ? ?HPI ?Jo Knapp is referred by Dr. Radford Pax for consideration for ICD insertion. She is a pleasant 61 yo woman with a non-ischemic CM, who has been treated with GDMT. She also has PAF and notes that she is out of rhythm 4-5 times a month, each lasting less than an hour. She denies syncope. No edema. No chest pain. She has class 2 CHF symptoms. Her ef has been 30% by echo.  ? ?No Known Allergies ? ? ?Current Outpatient Medications  ?Medication Sig Dispense Refill  ? acetaminophen (TYLENOL) 325 MG tablet Take 2 tablets (650 mg total) by mouth every 6 (six) hours as needed for mild pain (or Fever >/= 101). 12 tablet 0  ? apixaban (ELIQUIS) 5 MG TABS tablet Take 1 tablet (5 mg total) by mouth 2 (two) times daily. Start around 05/12/20 after completing initial started pack 60 tablet 5  ? ascorbic acid (VITAMIN C) 500 MG tablet Take 1 tablet (500 mg total) by mouth daily. 30 tablet 5  ? dapagliflozin propanediol (FARXIGA) 10 MG TABS tablet Take 1 tablet (10 mg total) by mouth daily before breakfast. For Heart 30 tablet 6  ? metoprolol succinate (TOPROL-XL) 50 MG 24 hr tablet Take 1 tablet (50 mg total) by mouth 2 (two) times daily. Take with or immediately following a meal. 60 tablet 6  ? metoprolol tartrate (LOPRESSOR) 100 MG tablet Take 1 tablet (100 mg total) by mouth once for 1 dose. Take 1 tablet two hours prior to CT scan 1 tablet 0  ? omeprazole (PRILOSEC) 20 MG capsule Take 1 capsule (20 mg total) by mouth daily. 30 capsule 3  ? potassium chloride (KLOR-CON) 10 MEQ tablet Take 1 tablet (10 mEq total) by mouth daily. Take While taking Lasix/furosemide 30 tablet 2  ? sacubitril-valsartan (ENTRESTO) 97-103 MG Take 1 tablet by mouth 2 (two) times daily. 180 tablet 3  ? senna (SENOKOT) 8.6 MG TABS tablet Take 1 tablet (8.6 mg total) by mouth daily as needed for mild constipation. 120 tablet 0  ? spironolactone (ALDACTONE) 25 MG tablet Take 0.5 tablets (12.5 mg total) by mouth daily. 30 tablet 5  ? zinc  sulfate 220 (50 Zn) MG capsule Take 1 capsule (220 mg total) by mouth daily. 30 capsule 3  ? ?No current facility-administered medications for this visit.  ? ? ? ?Past Medical History:  ?Diagnosis Date  ? Aortic atherosclerosis ([place])   ? Ascending aorta dilatation (HCC)   ? 30mm by Chest CTA 2/23  ? HFrEF (heart failure with reduced ejection fraction) ([place])   ? a. EF 25-30% by echo in 02/2021  ? Hypertension   ? LV dysfunction   ? Paroxysmal A-fib ([place])   ? a. diagnosed in 02/2021  ? ? ?ROS: ? ? All systems reviewed and negative except as noted in the HPI. ? ? ?Past Surgical History:  ?Procedure Laterality Date  ? ANKLE SURGERY    ? CESAREAN SECTION WITH BILATERAL TUBAL LIGATION    ? ? ? ?Family History  ?Adopted: Yes  ?Problem Relation Age of Onset  ? Heart disease Mother   ? Kidney disease Father   ? ? ? ?Social History  ? ?Socioeconomic History  ? Marital status: Single  ?  Spouse name: Not on file  ? Number of children: Not on file  ? Years of education: Not on file  ? Highest education level: Not on file  ?Occupational History  ? Not on file  ?Tobacco Use  ?  Smoking status: Never  ? Smokeless tobacco: Never  ?Vaping Use  ? Vaping Use: Never used  ?Substance and Sexual Activity  ? Alcohol use: Yes  ? Drug use: Not Currently  ? Sexual activity: Not on file  ?Other Topics Concern  ? Not on file  ?Social History Narrative  ? Not on file  ? ?Social Determinants of Health  ? ?Financial Resource Strain: Not on file  ?Food Insecurity: Not on file  ?Transportation Needs: Not on file  ?Physical Activity: Not on file  ?Stress: Not on file  ?Social Connections: Not on file  ?Intimate Partner Violence: Not on file  ? ? ? ?BP (!) 158/108   Pulse 77   Ht 5\' 6"  (1.676 m)   Wt 224 lb (101.6 kg)   SpO2 96%   BMI 36.15 kg/m?  ? ?Physical Exam: ? ?Well appearing middle aged woman, NAD ?HEENT: Unremarkable ?Neck:  No JVD, no thyromegally ?Lymphatics:  No adenopathy ?Back:  No CVA tenderness ?Lungs:  Clear with no  wheezes ?HEART:  Regular rate rhythm, no murmurs, no rubs, no clicks ?Abd:  soft, positive bowel sounds, no organomegally, no rebound, no guarding ?Ext:  2 plus pulses, no edema, no cyanosis, no clubbing ?Skin:  No rashes no nodules ?Neuro:  CN II through XII intact, motor grossly intact ? ?Assess/Plan:  ?Chronic systolic heart failure - she has class 2 symptoms despite GDMT and her repeat EF is 30%. She will continue her current meds. I have discussed the risks/benefits/goals/expectations of ICD insertion and she will call us if she wishes to proceed.  ?PAF - she appears to be maintaining NSR and her atrial fib burden is very mild based on her history. I would recommend continued medical therapy and would hold off on AA drug therapy at this time.  ?HTN - her bp is elevated a bit. We will need to work on this after ICD insertion.  ?Coags - Ok to stop her blood thinner for 2-3 days prior to ICD insertion.  ? ?Carleene Overlie Cheria Sadiq,MD ?

## 2021-10-14 NOTE — Progress Notes (Signed)
? ? ? ? ?HPI ?Jo Knapp is referred by Dr. Radford Pax for consideration for ICD insertion. She is a pleasant 61 yo woman with a non-ischemic CM, who has been treated with GDMT. She also has PAF and notes that she is out of rhythm 4-5 times a month, each lasting less than an hour. She denies syncope. No edema. No chest pain. She has class 2 CHF symptoms. Her ef has been 30% by echo.  ? ?No Known Allergies ? ? ?Current Outpatient Medications  ?Medication Sig Dispense Refill  ? acetaminophen (TYLENOL) 325 MG tablet Take 2 tablets (650 mg total) by mouth every 6 (six) hours as needed for mild pain (or Fever >/= 101). 12 tablet 0  ? apixaban (ELIQUIS) 5 MG TABS tablet Take 1 tablet (5 mg total) by mouth 2 (two) times daily. Start around 05/12/20 after completing initial started pack 60 tablet 5  ? ascorbic acid (VITAMIN C) 500 MG tablet Take 1 tablet (500 mg total) by mouth daily. 30 tablet 5  ? dapagliflozin propanediol (FARXIGA) 10 MG TABS tablet Take 1 tablet (10 mg total) by mouth daily before breakfast. For Heart 30 tablet 6  ? metoprolol succinate (TOPROL-XL) 50 MG 24 hr tablet Take 1 tablet (50 mg total) by mouth 2 (two) times daily. Take with or immediately following a meal. 60 tablet 6  ? metoprolol tartrate (LOPRESSOR) 100 MG tablet Take 1 tablet (100 mg total) by mouth once for 1 dose. Take 1 tablet two hours prior to CT scan 1 tablet 0  ? omeprazole (PRILOSEC) 20 MG capsule Take 1 capsule (20 mg total) by mouth daily. 30 capsule 3  ? potassium chloride (KLOR-CON) 10 MEQ tablet Take 1 tablet (10 mEq total) by mouth daily. Take While taking Lasix/furosemide 30 tablet 2  ? sacubitril-valsartan (ENTRESTO) 97-103 MG Take 1 tablet by mouth 2 (two) times daily. 180 tablet 3  ? senna (SENOKOT) 8.6 MG TABS tablet Take 1 tablet (8.6 mg total) by mouth daily as needed for mild constipation. 120 tablet 0  ? spironolactone (ALDACTONE) 25 MG tablet Take 0.5 tablets (12.5 mg total) by mouth daily. 30 tablet 5  ? zinc  sulfate 220 (50 Zn) MG capsule Take 1 capsule (220 mg total) by mouth daily. 30 capsule 3  ? ?No current facility-administered medications for this visit.  ? ? ? ?Past Medical History:  ?Diagnosis Date  ? Aortic atherosclerosis ([place])   ? Ascending aorta dilatation (HCC)   ? 45mm by Chest CTA 2/23  ? HFrEF (heart failure with reduced ejection fraction) ([place])   ? a. EF 25-30% by echo in 02/2021  ? Hypertension   ? LV dysfunction   ? Paroxysmal A-fib ([place])   ? a. diagnosed in 02/2021  ? ? ?ROS: ? ? All systems reviewed and negative except as noted in the HPI. ? ? ?Past Surgical History:  ?Procedure Laterality Date  ? ANKLE SURGERY    ? CESAREAN SECTION WITH BILATERAL TUBAL LIGATION    ? ? ? ?Family History  ?Adopted: Yes  ?Problem Relation Age of Onset  ? Heart disease Mother   ? Kidney disease Father   ? ? ? ?Social History  ? ?Socioeconomic History  ? Marital status: Single  ?  Spouse name: Not on file  ? Number of children: Not on file  ? Years of education: Not on file  ? Highest education level: Not on file  ?Occupational History  ? Not on file  ?Tobacco Use  ?  Smoking status: Never  ? Smokeless tobacco: Never  ?Vaping Use  ? Vaping Use: Never used  ?Substance and Sexual Activity  ? Alcohol use: Yes  ? Drug use: Not Currently  ? Sexual activity: Not on file  ?Other Topics Concern  ? Not on file  ?Social History Narrative  ? Not on file  ? ?Social Determinants of Health  ? ?Financial Resource Strain: Not on file  ?Food Insecurity: Not on file  ?Transportation Needs: Not on file  ?Physical Activity: Not on file  ?Stress: Not on file  ?Social Connections: Not on file  ?Intimate Partner Violence: Not on file  ? ? ? ?BP (!) 158/108   Pulse 77   Ht 5\' 6"  (1.676 m)   Wt 224 lb (101.6 kg)   SpO2 96%   BMI 36.15 kg/m?  ? ?Physical Exam: ? ?Well appearing middle aged woman, NAD ?HEENT: Unremarkable ?Neck:  No JVD, no thyromegally ?Lymphatics:  No adenopathy ?Back:  No CVA tenderness ?Lungs:  Clear with no  wheezes ?HEART:  Regular rate rhythm, no murmurs, no rubs, no clicks ?Abd:  soft, positive bowel sounds, no organomegally, no rebound, no guarding ?Ext:  2 plus pulses, no edema, no cyanosis, no clubbing ?Skin:  No rashes no nodules ?Neuro:  CN II through XII intact, motor grossly intact ? ?Assess/Plan:  ?Chronic systolic heart failure - she has class 2 symptoms despite GDMT and her repeat EF is 30%. She will continue her current meds. I have discussed the risks/benefits/goals/expectations of ICD insertion and she will call us if she wishes to proceed.  ?PAF - she appears to be maintaining NSR and her atrial fib burden is very mild based on her history. I would recommend continued medical therapy and would hold off on AA drug therapy at this time.  ?HTN - her bp is elevated a bit. We will need to work on this after ICD insertion.  ?Coags - Ok to stop her blood thinner for 2-3 days prior to ICD insertion.  ? ?Carleene Overlie Porche Steinberger,MD ?

## 2021-10-27 IMAGING — CT CT ANGIO CHEST
2 of 6 series · 18 of 46 positions shown · IV contrast (Omnipaque or Isovue)
Comparison: None.

CLINICAL DATA: COVID positive patient. Shortness of breath starting
today. Suspected pulmonary embolus with high probability.

EXAM:
CT ANGIOGRAPHY CHEST WITH CONTRAST
TECHNIQUE: Multidetector CT imaging of the chest was performed using the
standard protocol during bolus administration of intravenous
contrast. Multiplanar CT image reconstructions and MIPs were
obtained to evaluate the vascular anatomy.
CONTRAST:  100mL OMNIPAQUE IOHEXOL 350 MG/ML SOLN

[Series 5: pe axial thins · axial · 0.59mm/px · z∈[+1272,+1514]mm · 15 of 266 slices shown]
[im 12/266  lung]
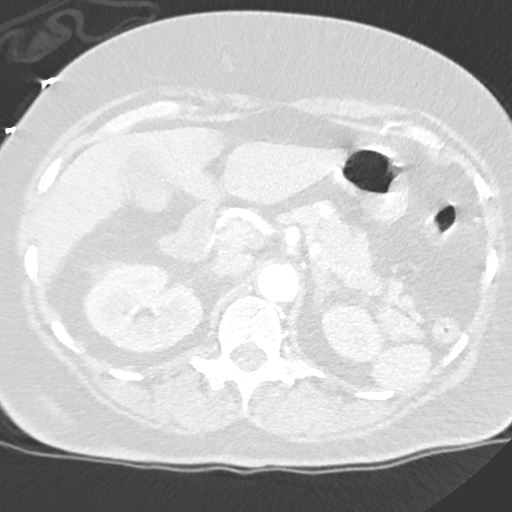
[im 35/266  soft-tissue]
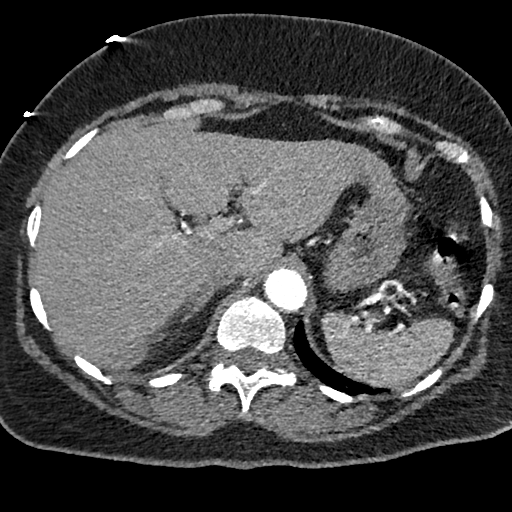
[im 47/266  lung]
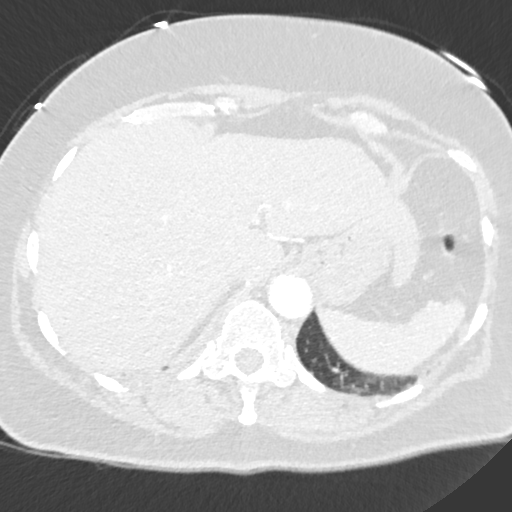
[im 70/266  soft-tissue]
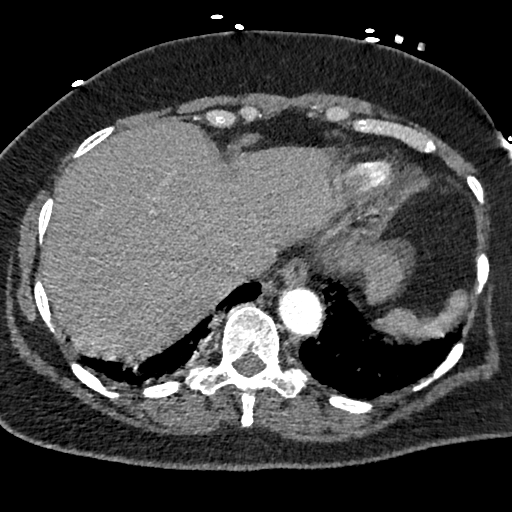
[im 81/266  lung]
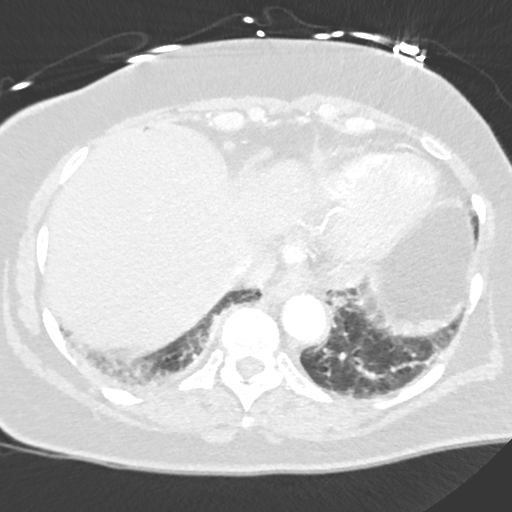
[im 104/266  soft-tissue]
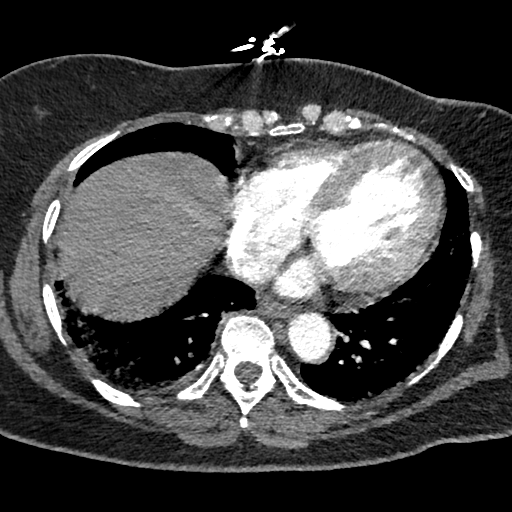
[im 116/266  lung]
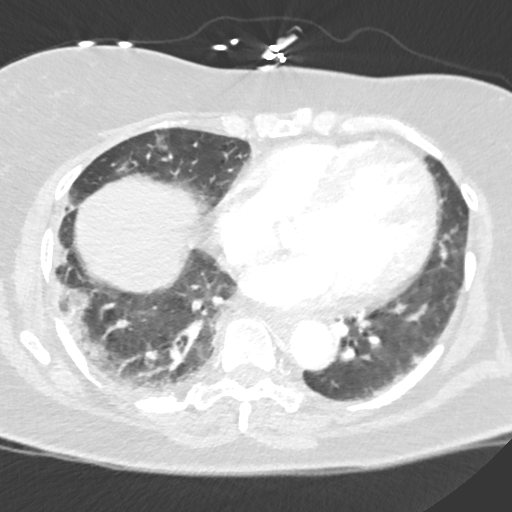
[im 139/266  soft-tissue]
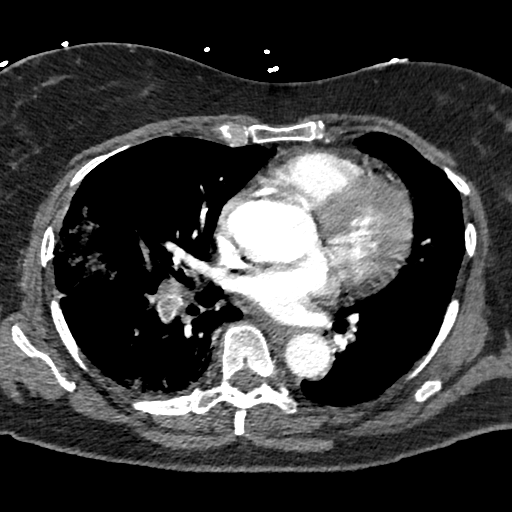
[im 150/266  lung]
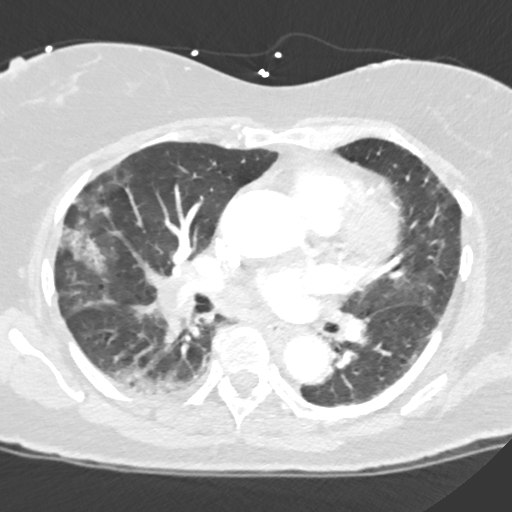
[im 162/266  soft-tissue]
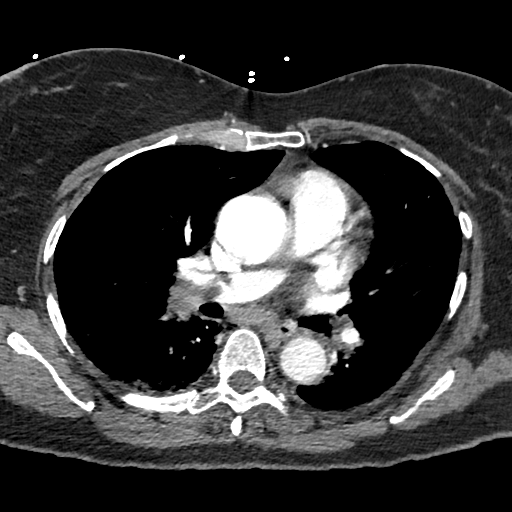
[im 185/266  lung]
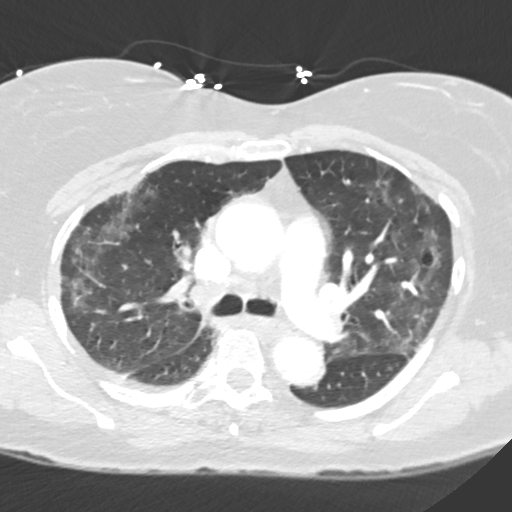
[im 196/266  soft-tissue]
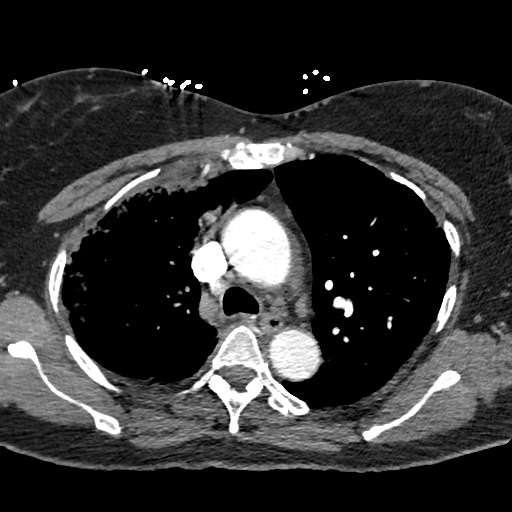
[im 219/266  lung]
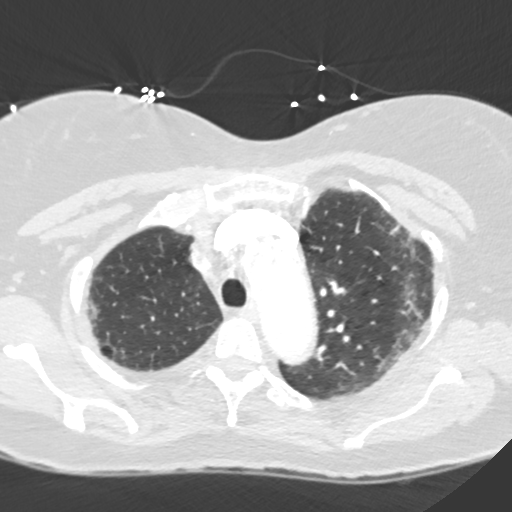
[im 231/266  soft-tissue]
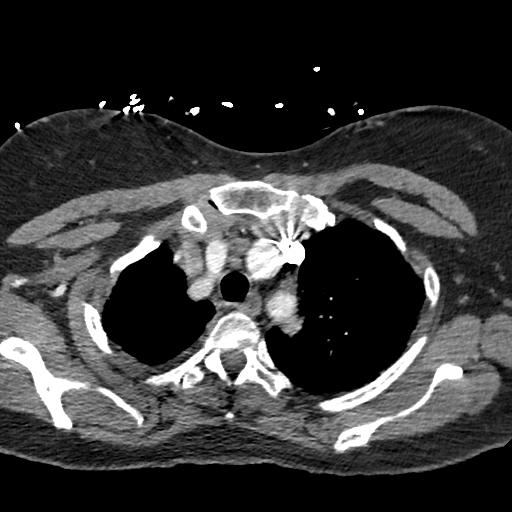
[im 254/266  lung]
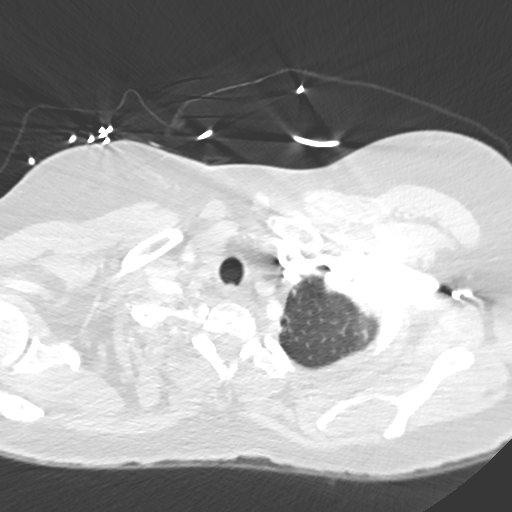

[Series 8: cor soft · coronal · 0.53mm/px · 3 of 126 slices shown]
[im 32/126  soft-tissue]
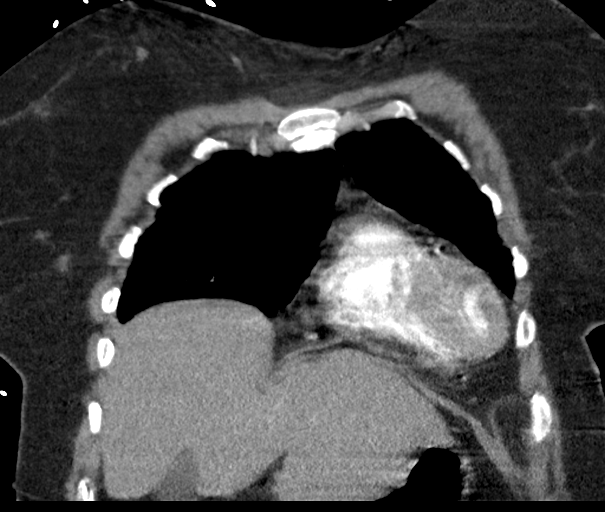
[im 63/126  soft-tissue]
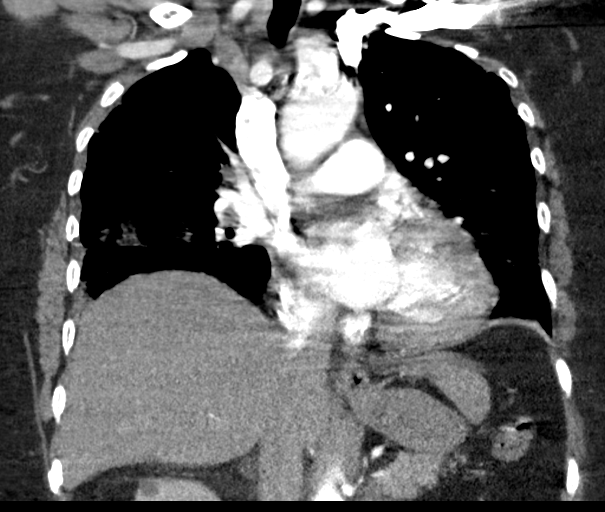
[im 94/126  soft-tissue]
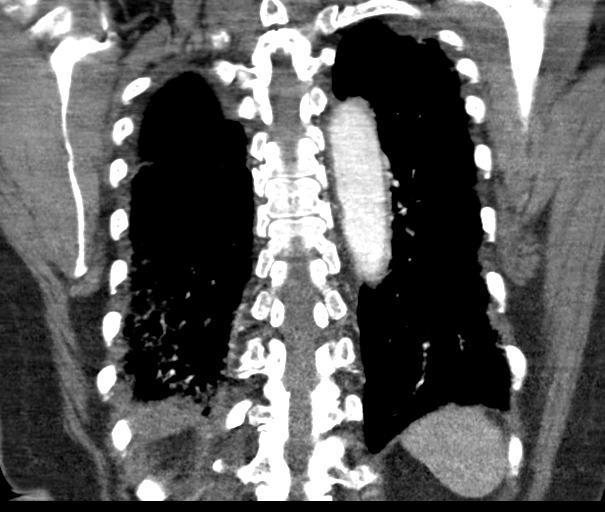

[18 of 46 positions shown; findings below may reference images not displayed]

FINDINGS: Cardiovascular: Good opacification of the central and segmental
pulmonary arteries. Large filling defects in the distal right lobar
pulmonary artery, extending into the right upper and lower lobe
segmental pulmonary arteries. Changes are consistent with acute
pulmonary embolus. The RV to LV ratio is 0.5, providing no evidence
of right heart strain. No pericardial effusions. Dilated ascending
aorta at 4.1 cm AP diameter. No aortic dissection. Great vessel
origins are patent.

Mediastinum/Nodes: Esophagus is decompressed. No significant
lymphadenopathy.

Lungs/Pleura: Patchy peripheral and perihilar infiltrates
bilaterally consistent with COVID pneumonia or residual fibrosis. No
pleural effusions. No pneumothorax. Airways are patent.

Upper Abdomen: No acute process demonstrated in the visualized upper
abdomen.

Musculoskeletal: No chest wall abnormality. No acute or significant
osseous findings.

Review of the MIP images confirms the above findings.
IMPRESSION: 1. Positive for acute pulmonary embolus in the distal right lobar
pulmonary artery, extending into the right upper and lower lobe
segmental pulmonary arteries. No evidence of right heart strain.
2. Patchy peripheral and perihilar infiltrates bilaterally
consistent with COVID pneumonia or fibrosis.
3. Dilated ascending aorta at 4.1 cm AP diameter.

Critical Value/emergent results were called by telephone at the time
of interpretation on 04/09/2020 at [DATE] to provider [address] LYFE ,
who verbally acknowledged these results.

## 2021-10-28 NOTE — Pre-Procedure Instructions (Signed)
Attempted to call patient regarding procedure instructions.  Left voice mail on the following items: ?Arrival time 0830 ?Nothing to eat or drink after midnight ?No meds AM of procedure ?Responsible person to drive you home and stay with you for 24 hrs ?Wash with special soap night before and morning of procedure ?If on anti-coagulant drug instructions Eliquis- should have held yesterday and today  ?

## 2021-10-29 ENCOUNTER — Encounter (HOSPITAL_COMMUNITY)
Admission: RE | Disposition: A | Discharge status: Home / Self Care | Payer: Self-pay | Source: Home / Self Care | Attending: Internal Medicine

## 2021-10-29 ENCOUNTER — Ambulatory Visit (HOSPITAL_COMMUNITY): Payer: 59

## 2021-10-29 ENCOUNTER — Ambulatory Visit (HOSPITAL_COMMUNITY)
Admission: RE | Admit: 2021-10-29 | Discharge: 2021-10-29 | Disposition: A | Discharge status: Home / Self Care | Payer: 59 | Attending: Internal Medicine | Admitting: Internal Medicine

## 2021-10-29 ENCOUNTER — Other Ambulatory Visit: Payer: Self-pay

## 2021-10-29 DIAGNOSIS — I480 Paroxysmal atrial fibrillation: Secondary | ICD-10-CM | POA: Insufficient documentation

## 2021-10-29 DIAGNOSIS — Z7901 Long term (current) use of anticoagulants: Secondary | ICD-10-CM | POA: Diagnosis not present

## 2021-10-29 DIAGNOSIS — I4892 Unspecified atrial flutter: Secondary | ICD-10-CM | POA: Insufficient documentation

## 2021-10-29 DIAGNOSIS — I5022 Chronic systolic (congestive) heart failure: Secondary | ICD-10-CM | POA: Diagnosis not present

## 2021-10-29 DIAGNOSIS — I110 Hypertensive heart disease with heart failure: Secondary | ICD-10-CM | POA: Diagnosis not present

## 2021-10-29 DIAGNOSIS — I428 Other cardiomyopathies: Secondary | ICD-10-CM | POA: Insufficient documentation

## 2021-10-29 HISTORY — PX: ICD IMPLANT: EP1208

## 2021-10-29 SURGERY — ICD IMPLANT

## 2021-10-29 MED ORDER — SODIUM CHLORIDE 0.9 % IV SOLN: Freq: Once | INTRAVENOUS | Status: AC

## 2021-10-29 MED ORDER — CEFAZOLIN SODIUM-DEXTROSE 1-4 GM/50ML-% IV SOLN
1.0000 g | Freq: Once | INTRAVENOUS | Status: AC
  Administered 2021-10-29: 1 g via INTRAVENOUS
  Filled 2021-10-29: qty 50

## 2021-10-29 MED ORDER — POVIDONE-IODINE 10 % EX SWAB
2.0000 "application " | Freq: Once | CUTANEOUS | Status: AC
  Administered 2021-10-29: 2 via TOPICAL

## 2021-10-29 MED ORDER — LIDOCAINE HCL (PF) 1 % IJ SOLN
INTRAMUSCULAR | Status: AC
  Filled 2021-10-29: qty 60

## 2021-10-29 MED ORDER — ACETAMINOPHEN 325 MG PO TABS
325.0000 mg | ORAL_TABLET | ORAL | Status: DC | PRN
  Administered 2021-10-29: 650 mg via ORAL
  Filled 2021-10-29: qty 2

## 2021-10-29 MED ORDER — CHLORHEXIDINE GLUCONATE 4 % EX LIQD
4.0000 "application " | Freq: Once | CUTANEOUS | Status: DC
  Filled 2021-10-29: qty 60

## 2021-10-29 MED ORDER — LIDOCAINE HCL (PF) 1 % IJ SOLN
INTRAMUSCULAR | Status: DC | PRN
  Administered 2021-10-29: 60 mL

## 2021-10-29 MED ORDER — FENTANYL CITRATE (PF) 100 MCG/2ML IJ SOLN
INTRAMUSCULAR | Status: DC | PRN
  Administered 2021-10-29: 12.5 ug via INTRAVENOUS
  Administered 2021-10-29: 25 ug via INTRAVENOUS
  Administered 2021-10-29: 12.5 ug via INTRAVENOUS

## 2021-10-29 MED ORDER — SODIUM CHLORIDE 0.9 % IV SOLN
80.0000 mg | INTRAVENOUS | Status: AC
  Administered 2021-10-29: 80 mg

## 2021-10-29 MED ORDER — FENTANYL CITRATE (PF) 100 MCG/2ML IJ SOLN
INTRAMUSCULAR | Status: AC
  Filled 2021-10-29: qty 2

## 2021-10-29 MED ORDER — ONDANSETRON HCL 4 MG/2ML IJ SOLN
4.0000 mg | Freq: Four times a day (QID) | INTRAMUSCULAR | Status: DC | PRN
  Administered 2021-10-29: 4 mg via INTRAVENOUS
  Filled 2021-10-29: qty 2

## 2021-10-29 MED ORDER — HEPARIN (PORCINE) IN NACL 1000-0.9 UT/500ML-% IV SOLN
INTRAVENOUS | Status: DC | PRN
  Administered 2021-10-29: 500 mL

## 2021-10-29 MED ORDER — CEFAZOLIN SODIUM-DEXTROSE 2-4 GM/100ML-% IV SOLN
2.0000 g | INTRAVENOUS | Status: AC
  Administered 2021-10-29: 2 g via INTRAVENOUS
  Filled 2021-10-29: qty 100

## 2021-10-29 MED ORDER — HEPARIN (PORCINE) IN NACL 1000-0.9 UT/500ML-% IV SOLN
INTRAVENOUS | Status: AC
  Filled 2021-10-29: qty 500

## 2021-10-29 MED ORDER — HYDRALAZINE HCL 20 MG/ML IJ SOLN
INTRAMUSCULAR | Status: AC
  Administered 2021-10-29: 20 mg via INTRAVENOUS
  Filled 2021-10-29: qty 1

## 2021-10-29 MED ORDER — MIDAZOLAM HCL 5 MG/5ML IJ SOLN
INTRAMUSCULAR | Status: DC | PRN
  Administered 2021-10-29: 2 mg via INTRAVENOUS
  Administered 2021-10-29 (×2): 1 mg via INTRAVENOUS

## 2021-10-29 MED ORDER — CEFAZOLIN SODIUM-DEXTROSE 2-4 GM/100ML-% IV SOLN
INTRAVENOUS | Status: AC
  Filled 2021-10-29: qty 100

## 2021-10-29 MED ORDER — SODIUM CHLORIDE 0.9 % IV SOLN
INTRAVENOUS | Status: AC
  Filled 2021-10-29: qty 2

## 2021-10-29 MED ORDER — MIDAZOLAM HCL 5 MG/5ML IJ SOLN
INTRAMUSCULAR | Status: AC
  Filled 2021-10-29: qty 5

## 2021-10-29 MED ORDER — SODIUM CHLORIDE 0.9 % IV SOLN: INTRAVENOUS | Status: DC

## 2021-10-29 MED ORDER — HYDRALAZINE HCL 20 MG/ML IJ SOLN
20.0000 mg | Freq: Once | INTRAMUSCULAR | Status: AC
Start: 2021-10-29 — End: 2021-10-29

## 2021-10-29 SURGICAL SUPPLY — 8 items
CABLE SURGICAL S-101-97-12 (CABLE) ×2 IMPLANT
ICD ELLIPSE DR CD2411-36C (ICD Generator) ×1 IMPLANT
LEAD DURATA 7122-60CM (Lead) ×1 IMPLANT
LEAD TENDRIL MRI 52CM LPA1200M (Lead) ×1 IMPLANT
PAD DEFIB RADIO PHYSIO CONN (PAD) ×2 IMPLANT
SHEATH 7FR PRELUDE SNAP 13 (SHEATH) ×1 IMPLANT
SHEATH 8FR PRELUDE SNAP 13 (SHEATH) ×1 IMPLANT
TRAY PACEMAKER INSERTION (PACKS) ×2 IMPLANT

## 2021-10-29 NOTE — Interval H&P Note (Signed)
History and Physical Interval Note: ? ?10/29/2021 ?10:23 AM ? ?Jo Knapp  has presented today for surgery, with the diagnosis of cardiomyopathy.  The various methods of treatment have been discussed with the patient and family. After consideration of risks, benefits and other options for treatment, the patient has consented to  Procedure(s): ?ICD IMPLANT (N/A) as a surgical intervention.  The patient's history has been reviewed, patient examined, no change in status, stable for surgery.  I have reviewed the patient's chart and labs.  Questions were answered to the patient's satisfaction.   ? ? ?Jo Knapp ? ? ?

## 2021-10-29 NOTE — Interval H&P Note (Signed)
History and Physical Interval Note: ? ?10/29/2021 ?10:25 AM ? ?Jo Knapp  has presented today for surgery, with the diagnosis of cardiomyopathy.  The various methods of treatment have been discussed with the patient and family. After consideration of risks, benefits and other options for treatment, the patient has consented to  Procedure(s): ?ICD IMPLANT (N/A) as a surgical intervention.  The patient's history has been reviewed, patient examined, no change in status, stable for surgery.  I have reviewed the patient's chart and labs.  Questions were answered to the patient's satisfaction.   ? ? ?Jo Knapp ? ? ?

## 2021-10-29 NOTE — Discharge Instructions (Addendum)
After Your ICD ?(Implantable Cardiac Defibrillator) ? ? ?You have a [employer] ICD ? ?ACTIVITY ?Do not lift your arm above shoulder height for 1 week after your procedure. After 7 days, you may progress as below.  ?You should remove your sling 24 hours after your procedure, unless otherwise instructed by your provider.  ? ? ? Wednesday November 05, 2021  Thursday November 06, 2021 Friday November 07, 2021 Saturday November 08, 2021  ? ?Do not lift, push, pull, or carry anything over 10 pounds with the affected arm until 6 weeks (Wednesday Dec 10, 2021 ) after your procedure.  ? ?You may drive AFTER your wound check, unless you have been told otherwise by your provider. MAY 3 ? ?Ask your healthcare provider when you can go back to work ? ? ?INCISION/Dressing ?If you are on a blood thinner such as  Eliquis, please resume. 11/03/21 ? ?If large square, outer bandage is left in place, this can be removed after 24 hours from your procedure. Do not remove steri-strips or glue as below.  ? ?Monitor your defibrillator site for redness, swelling, and drainage. Call the device clinic at [phone] if you experience these symptoms or fever/chills. ? ?If your incision is sealed with Steri-strips or staples, you may shower 10 days after your procedure or when told by your provider. Do not remove the steri-strips or let the shower hit directly on your site. You may wash around your site with soap and water.  KEEP DRY TILL WOUND CHECK MAY 3 ? ?If you were discharged in a sling, please do not wear this during the day more than 48 hours after your surgery unless otherwise instructed. This may increase the risk of stiffness and soreness in your shoulder.  ? ?Avoid lotions, ointments, or perfumes over your incision until it is well-healed. ? ?You may use a hot tub or a pool AFTER your wound check appointment if the incision is completely closed. ? ?Your ICD is designed to protect you from life threatening heart rhythms. Because of this, you may  receive a shock.  ? ?1 shock with no symptoms:  Call the office during business hours. ?1 shock with symptoms (chest pain, chest pressure, dizziness, lightheadedness, shortness of breath, overall feeling unwell):  Call 911. ?If you experience 2 or more shocks in 24 hours:  Call 911. ?If you receive a shock, you should not drive for 6 months per the [place] DMV IF you receive appropriate therapy from your ICD.  ? ?ICD Alerts:  Some alerts are vibratory and others beep. These are NOT emergencies. Please call our office to let us know. If this occurs at night or on weekends, it can wait until the next business day. Send a remote transmission. ? ?If your device is capable of reading fluid status (for heart failure), you will be offered monthly monitoring to review this with you.  ? ?DEVICE MANAGEMENT ?Remote monitoring is used to monitor your ICD from home. This monitoring is scheduled every 91 days by our office. It allows [place] to keep an eye on the functioning of your device to ensure it is working properly. You will routinely see your Electrophysiologist annually (more often if necessary).  ? ?You should receive your ID card for your new device in 4-8 weeks. Keep this card with you at all times once received. Consider wearing a medical alert bracelet or necklace. ? ?Your ICD  may be MRI compatible. This will be discussed at your next office visit/wound check.  You should avoid contact with strong electric or magnetic fields.  ? ?Do not use amateur (ham) radio equipment or electric (arc) welding torches. MP3 player headphones with magnets should not be used. Some devices are safe to use if held at least 12 inches (30 cm) from your defibrillator. These include power tools, lawn mowers, and speakers. If you are unsure if something is safe to use, ask your health care provider. ? ?When using your cell phone, hold it to the ear that is on the opposite side from the defibrillator. Do not leave your cell phone in a pocket over the  defibrillator. ? ?You may safely use electric blankets, heating pads, computers, and microwave ovens. ? ?Call the office right away if: ?You have chest pain. ?You feel more than one shock. ?You feel more short of breath than you have felt before. ?You feel more light-headed than you have felt before. ?Your incision starts to open up. ? ?This information is not intended to replace advice given to you by your health care provider. Make sure you discuss any questions you have with your health care provider. ? ? ?  ?

## 2021-10-30 ENCOUNTER — Encounter (HOSPITAL_COMMUNITY): Payer: Self-pay | Admitting: Internal Medicine

## 2021-10-30 ENCOUNTER — Telehealth: Payer: Self-pay

## 2021-10-30 NOTE — Telephone Encounter (Signed)
-----   Message from Shirley Friar, PA-C sent at 10/29/2021  1:43 PM EDT ----- ?Same day ICD GT  ? ?

## 2021-10-30 NOTE — Telephone Encounter (Signed)
LVM for patient to call device clinic back, direct number given. [address] monitor updated 10/29/21  ?

## 2021-11-03 NOTE — Telephone Encounter (Signed)
Follow-up after same day discharge: ?Implant date: 10/29/2021 ?MD: Lewayne Bunting, MD ?Device: [facility] [employer] [phone] [address] ?Location: Left Chest ? ? ?Wound check visit: 11/12/2021 @ 2:40 PM ?90 day MD follow-up: 01/21/2022 @ 10:00 ? ?Dressing removed: Yes ?Sling Removed: Yes  ?

## 2021-11-03 NOTE — Telephone Encounter (Signed)
2nd attempt to contact patient for same day d/c. No answer, LMTCB. ?

## 2021-11-12 ENCOUNTER — Ambulatory Visit (INDEPENDENT_AMBULATORY_CARE_PROVIDER_SITE_OTHER): Payer: 59

## 2021-11-12 DIAGNOSIS — I480 Paroxysmal atrial fibrillation: Secondary | ICD-10-CM

## 2021-11-12 DIAGNOSIS — I428 Other cardiomyopathies: Secondary | ICD-10-CM

## 2021-11-12 NOTE — Patient Instructions (Signed)

## 2021-11-14 ENCOUNTER — Other Ambulatory Visit (HOSPITAL_COMMUNITY)
Admission: RE | Admit: 2021-11-14 | Discharge: 2021-11-14 | Disposition: A | Discharge status: Home / Self Care | Payer: 59 | Source: Ambulatory Visit | Attending: Cardiology | Admitting: Cardiology

## 2021-11-14 ENCOUNTER — Encounter: Payer: Self-pay | Admitting: Cardiology

## 2021-11-14 ENCOUNTER — Ambulatory Visit (INDEPENDENT_AMBULATORY_CARE_PROVIDER_SITE_OTHER): Payer: 59 | Admitting: Cardiology

## 2021-11-14 VITALS — BP 130/80 | HR 76 | Ht 66.0 in | Wt 226.0 lb

## 2021-11-14 DIAGNOSIS — I5020 Unspecified systolic (congestive) heart failure: Secondary | ICD-10-CM | POA: Diagnosis not present

## 2021-11-14 DIAGNOSIS — I10 Essential (primary) hypertension: Secondary | ICD-10-CM

## 2021-11-14 DIAGNOSIS — I480 Paroxysmal atrial fibrillation: Secondary | ICD-10-CM

## 2021-11-14 DIAGNOSIS — I77810 Thoracic aortic ectasia: Secondary | ICD-10-CM

## 2021-11-14 DIAGNOSIS — Z86711 Personal history of pulmonary embolism: Secondary | ICD-10-CM | POA: Diagnosis not present

## 2021-11-14 LAB — BASIC METABOLIC PANEL
Anion gap: 7 (ref 5–15)
BUN: 23 mg/dL — ABNORMAL HIGH (ref 6–20)
CO2: 27 mmol/L (ref 22–32)
Calcium: 9.4 mg/dL (ref 8.9–10.3)
Chloride: 104 mmol/L (ref 98–111)
Creatinine, Ser: 0.9 mg/dL (ref 0.44–1.00)
GFR, Estimated: >60 mL/min (ref >60)
Glucose, Bld: 97 mg/dL (ref 70–99)
Potassium: 4.2 mmol/L (ref 3.5–5.1)
Sodium: 138 mmol/L (ref 135–145)

## 2021-11-14 LAB — FERRITIN: Ferritin: 149 ng/mL (ref 11–307)

## 2021-11-14 NOTE — Addendum Note (Signed)
Addended by: Leonides Schanz C on: 11/14/2021 02:00 PM ? ? Modules accepted: Orders ? ?

## 2021-11-14 NOTE — Patient Instructions (Signed)
Medication Instructions:  ?No changes made today  ? ?Labwork: ?Please have lab work completed today at [facility] ? ?Testing/Procedures: ?None today  ? ?Follow-Up: ?Follow up with Dr. Mayford Knife in 6 months.  ? ?Any Other Special Instructions Will Be Listed Below (If Applicable). ? ? ? ? ?If you need a refill on your cardiac medications before your next appointment, please call your pharmacy. ? ?

## 2021-11-14 NOTE — Progress Notes (Signed)
? ?Cardiology Office Note   ? ?Date:  11/14/2021  ? ?ID:  Jo Knapp, DOB 1961/07/07, MRN [id number] ? ?PCP:  Ludwig Clarks, FNP  ?Cardiologist: Evaluated by [place] MD during recent admission --> Will need to establish with MD in [place] ? ?Chief Complaint  ?Patient presents with  ? Atrial Fibrillation  ? Cardiomyopathy  ? Congestive Heart Failure  ? Hypertension  ? ? ? ?History of Present Illness:   ? ?Jo Knapp is a 61 y.o. female with past medical history of prior PE (diagnosed in 04/2020 in the setting of COVID-19 PNA), HTN, newly diagnosed atrial fibrillation (in 02/2021 and spontaneously converted back to NSR) and newly diagnosed cardiomyopathy (EF 25-30% by echo in 02/2021) felt tachycardia mediated currently on GDMT but with no significant improvement in EF and now status post ICD implantation for primary prevention. ? ?She presented to [facility] ED in 02/2021 for evaluation of worsening dyspnea, nausea and vomiting for the past several days. She was found to be in hypertensive urgency upon arrival and also to be in new-onset atrial fibrillation with RVR. She did convert back to normal sinus rhythm while in the ED and maintained normal sinus rhythm during admission. CT Abdomen during admission did show infarcts along the inferior spleen. She was started on Toprol-XL for rate control along with Eliquis for anticoagulation. An echocardiogram was obtained and showed her EF was reduced at 25 to 30% and was felt to possibly be tachycardia mediated in the setting of her atrial fibrillation with RVR or due to uncontrolled HTN. She was started on Farxiga 5 mg daily (should be 10mg  daily for CHF), Toprol-XL 50mg  daily, Entresto 49-51mg  BID and Spironolactone 12.5mg  daily prior to discharge with plans for repeat echocardiogram in several months for reassessment of her EF following titration of GDMT and ischemic evaluation at that time if her EF remained reduced.  ? ?At her her last OV she was  complaining of intermittent chest pain and SOB that had been occurring off and on for a while.  She sat that she would wake up from sleep with chest pain and could not catch her breath. Her chest would feel heavy and tight with no radiation.  Coronary CTA was performed on 08/21/2021 which revealed no coronary disease with a coronary calcium score of 0.  Chest pain is noncardiac.  2D echo done 09/22/2021 showed slight improvement in EF from 25 to 30% down to 30 to 35% with global hypokinesis, G1 DD and mild LVH.  There was mild to moderate RV dysfunction, moderate left atrial enlargement and mildly dilated ascending aorta at 40 mmHg and root at 39 mmHg. ? ?Due to persistence of LV dysfunction she was referred to EP and Dr. Lovena Le saw her for consideration of ICD insertion for primary prevention.  On 10/29/2021 [place] for nonischemic cardiomyopathy with chronic NYHA class II heart failure. ? ?She is here today for followup and is doing well.  She denies any chest pain or pressure,  PND, orthopnea, LE edema, dizziness, palpitations or syncope. She has had chronic DOE after having COVID .  She is compliant with her meds and is tolerating meds with no SE.    ? ?. ?Past Medical History:  ?Diagnosis Date  ? Aortic atherosclerosis ([place])   ? Ascending aorta dilatation (HCC)   ? 33mm by Chest CTA 2/23  ? HFrEF (heart failure with reduced ejection fraction) ([place])   ? a. EF 25-30% by echo in 02/2021  and 30-35% on echo 09/2021 s/p AICD.  No CAD on coronary CTA  ? Hypertension   ? Paroxysmal A-fib (HCC)   ? a. diagnosed in 02/2021  ? ? ?Past Surgical History:  ?Procedure Laterality Date  ? ANKLE SURGERY    ? CESAREAN SECTION WITH BILATERAL TUBAL LIGATION    ? ICD IMPLANT N/A 10/29/2021  ? Procedure: ICD IMPLANT;  Surgeon: Marinus Maw, MD;  Location: [facility] INVASIVE CV LAB;  Service: Cardiovascular;  Laterality: N/A;  ? ? ?Current Medications: ?Outpatient Medications Prior to Visit  ?Medication Sig Dispense  Refill  ? acetaminophen (TYLENOL) 325 MG tablet Take 2 tablets (650 mg total) by mouth every 6 (six) hours as needed for mild pain (or Fever >/= 101). 12 tablet 0  ? apixaban (ELIQUIS) 5 MG TABS tablet Take 1 tablet (5 mg total) by mouth 2 (two) times daily. Start around 05/12/20 after completing initial started pack 60 tablet 5  ? ascorbic acid (VITAMIN C) 500 MG tablet Take 1 tablet (500 mg total) by mouth daily. 30 tablet 5  ? atorvastatin (LIPITOR) 20 MG tablet Take 20 mg by mouth daily.    ? dapagliflozin propanediol (FARXIGA) 10 MG TABS tablet Take 1 tablet (10 mg total) by mouth daily before breakfast. For Heart 30 tablet 6  ? metoprolol succinate (TOPROL-XL) 50 MG 24 hr tablet Take 1 tablet (50 mg total) by mouth 2 (two) times daily. Take with or immediately following a meal. 60 tablet 6  ? Multiple Vitamins-Minerals (MULTIVITAMIN WITH MINERALS) tablet Take 1 tablet by mouth daily. Vita fusion    ? omeprazole (PRILOSEC) 20 MG capsule Take 1 capsule (20 mg total) by mouth daily. 30 capsule 3  ? potassium chloride (KLOR-CON) 10 MEQ tablet Take 1 tablet (10 mEq total) by mouth daily. Take While taking Lasix/furosemide 30 tablet 2  ? sacubitril-valsartan (ENTRESTO) 97-103 MG Take 1 tablet by mouth 2 (two) times daily. 180 tablet 3  ? senna (SENOKOT) 8.6 MG TABS tablet Take 1 tablet (8.6 mg total) by mouth daily as needed for mild constipation. 120 tablet 0  ? spironolactone (ALDACTONE) 25 MG tablet Take 0.5 tablets (12.5 mg total) by mouth daily. 30 tablet 5  ? zinc sulfate 220 (50 Zn) MG capsule Take 1 capsule (220 mg total) by mouth daily. 30 capsule 3  ? ?No facility-administered medications prior to visit.  ?  ? ?Allergies:   Patient has no known allergies.  ? ?Social History  ? ?Socioeconomic History  ? Marital status: Single  ?  Spouse name: Not on file  ? Number of children: Not on file  ? Years of education: Not on file  ? Highest education level: Not on file  ?Occupational History  ? Not on file   ?Tobacco Use  ? Smoking status: Never  ? Smokeless tobacco: Never  ?Vaping Use  ? Vaping Use: Never used  ?Substance and Sexual Activity  ? Alcohol use: Yes  ? Drug use: Not Currently  ? Sexual activity: Not on file  ?Other Topics Concern  ? Not on file  ?Social History Narrative  ? Not on file  ? ?Social Determinants of Health  ? ?Financial Resource Strain: Not on file  ?Food Insecurity: Not on file  ?Transportation Needs: Not on file  ?Physical Activity: Not on file  ?Stress: Not on file  ?Social Connections: Not on file  ?  ? ?Family History:  The patient's family history includes Heart disease in her mother; Kidney disease in her  father. She was adopted.  ? ?Review of Systems:   ? ?Please see the history of present illness.    ? ?All other systems reviewed and are otherwise negative except as noted above. ? ? ?Physical Exam:   ? ?VS:  BP 130/80   Pulse 76   Ht 5\' 6"  (1.676 m)   Wt 226 lb (102.5 kg)   SpO2 96%   BMI 36.48 kg/m?    ? ?GEN: Well nourished, well developed in no acute distress ?HEENT: Normal ?NECK: No JVD; No carotid bruits ?LYMPHATICS: No lymphadenopathy ?CARDIAC:RRR, no murmurs, rubs, gallops ?RESPIRATORY:  Clear to auscultation without rales, wheezing or rhonchi  ?ABDOMEN: Soft, non-tender, non-distended ?MUSCULOSKELETAL:  No edema; No deformity  ?SKIN: Warm and dry ?NEUROLOGIC:  Alert and oriented x 3 ?PSYCHIATRIC:  Normal affect   ? ?Wt Readings from Last 3 Encounters:  ?11/14/21 226 lb (102.5 kg)  ?10/29/21 220 lb (99.8 kg)  ?10/14/21 224 lb (101.6 kg)  ?  ? ?Studies/Labs Reviewed:  ? ?EKG:  EKG is not ordered today  ? ?Recent Labs: ?02/18/2021: TSH 2.122 ?02/21/2021: ALT 321; Magnesium 1.9 ?10/14/2021: BUN 15; Creatinine, Ser 0.82; Hemoglobin 16.3; Platelets 289; Potassium 4.1; Sodium 141  ? ?Lipid Panel ?   ?Component Value Date/Time  ? TRIG 78 04/09/2020 0427  ? ? ?Additional studies/ records that were reviewed today include:  ?Coronary CTA 08/21/2021 ?IMPRESSION: ?1. No evidence of CAD,  CADRADS = 0. ?  ?2. Coronary calcium score of 0. This was 0 percentile for age and ?sex matched control. ?  ?3. Normal coronary origin with right dominance. ?  ?4. Aorta: Borderline dilated at 38 mm at the mid ascending aor

## 2021-11-15 LAB — HIV-1 RNA QUANT-NO REFLEX-BLD
HIV 1 RNA Quant: <20 copies/mL
LOG10 HIV-1 RNA: UNDETERMINED log10copy/mL

## 2021-11-17 LAB — CUP PACEART INCLINIC DEVICE CHECK
Battery Remaining Longevity: 99 mo
Brady Statistic RA Percent Paced: 0.94 %
Brady Statistic RV Percent Paced: 0.78 %
Date Time Interrogation Session: 20230503140400
HighPow Impedance: 72 Ohm
Implantable Lead Implant Date: 20230419
Implantable Lead Implant Date: 20230419
Implantable Lead Location: 753862
Implantable Lead Location: 753862
Implantable Lead Model: 7122
Implantable Pulse Generator Implant Date: 20230419
Lead Channel Impedance Value: 387.5 Ohm
Lead Channel Impedance Value: 550 Ohm
Lead Channel Pacing Threshold Amplitude: 0.5 V
Lead Channel Pacing Threshold Amplitude: 0.5 V
Lead Channel Pacing Threshold Amplitude: 0.5 V
Lead Channel Pacing Threshold Amplitude: 0.5 V
Lead Channel Pacing Threshold Pulse Width: 0.5 ms
Lead Channel Pacing Threshold Pulse Width: 0.5 ms
Lead Channel Pacing Threshold Pulse Width: 0.5 ms
Lead Channel Pacing Threshold Pulse Width: 0.5 ms
Lead Channel Sensing Intrinsic Amplitude: 11.8 mV
Lead Channel Sensing Intrinsic Amplitude: 5 mV
Lead Channel Setting Pacing Amplitude: 3.5 V
Lead Channel Setting Pacing Amplitude: 3.5 V
Lead Channel Setting Pacing Pulse Width: 0.5 ms
Lead Channel Setting Sensing Sensitivity: 0.5 mV
Pulse Gen Serial Number: 8939155

## 2021-11-17 LAB — PROTEIN ELECTROPHORESIS, SERUM
A/G Ratio: 1.1 (ref 0.7–1.7)
Albumin ELP: 4.1 g/dL (ref 2.9–4.4)
Alpha-1-Globulin: 0.2 g/dL (ref 0.0–0.4)
Alpha-2-Globulin: 0.5 g/dL (ref 0.4–1.0)
Beta Globulin: 1.2 g/dL (ref 0.7–1.3)
Gamma Globulin: 1.7 g/dL (ref 0.4–1.8)
Globulin, Total: 3.6 g/dL (ref 2.2–3.9)
Total Protein ELP: 7.7 g/dL (ref 6.0–8.5)

## 2021-11-17 NOTE — Progress Notes (Signed)

## 2021-11-18 LAB — UIFE/LIGHT CHAINS/TP QN, 24-HR UR
Free Kappa Lt Chains,Ur: 50.89 mg/L (ref 1.17–86.46)
Free Kappa/Lambda Ratio: 6.52 (ref 1.83–14.26)
Free Lambda Lt Chains,Ur: 7.8 mg/L (ref 0.27–15.21)
Total Protein, Urine: 64.1 mg/dL

## 2021-11-28 ENCOUNTER — Encounter: Payer: Self-pay | Admitting: Internal Medicine

## 2021-11-28 ENCOUNTER — Telehealth: Payer: Self-pay

## 2021-11-28 NOTE — Telephone Encounter (Signed)
Awaiting on picture at this time.

## 2021-11-28 NOTE — Telephone Encounter (Signed)
Patient reports of "a small amount of swelling" at device site x 1-2 days. Denies any redness, drainage, bleeding, warmth or discoloration. Reports of very mild tenderness when palpated. Advised patient I will forward images to Dr. Lovena Le and will call with follow up once reviewed. Patient aware to call if swelling increases or other signs of infection such as redness, discoloration, drainage or bleeding.    Device was placed 10/29/2021

## 2021-11-28 NOTE — Telephone Encounter (Signed)
The patient states her incision site is swollen. It hurts a little but not a lot. I asked her to send a picture through my-chart for the nurse to look at. She agreed to send the picture. I told her once the nurse receive the picture the nurse will give her a call back.

## 2021-12-03 ENCOUNTER — Ambulatory Visit: Payer: 59 | Admitting: Internal Medicine

## 2021-12-07 NOTE — Progress Notes (Deleted)
Cardiology Office Note:    Date:  12/07/2021   ID:  Jo Knapp, [place] Jul 10, 1961, MRN [id number]  PCP:  Jo Grout, FNP   [facility] Providers Cardiologist:  None { Click to update primary MD,subspecialty MD or APP then REFRESH:1}    Referring MD: Jo Grout, FNP   Chief Complaint: ***  History of Present Illness:    Jo Knapp is a *** 61 y.o. female with a hx of ***  Past Medical History:  Diagnosis Date   Aortic atherosclerosis (HCC)    Ascending aorta dilatation (HCC)    56mm by Chest CTA 2/23   HFrEF (heart failure with reduced ejection fraction) (HCC)    a. EF 25-30% by echo in 02/2021 and 30-35% on echo 09/2021 s/p AICD.  No CAD on coronary CTA   Hypertension    Paroxysmal A-fib (HCC)    a. diagnosed in 02/2021    Past Surgical History:  Procedure Laterality Date   ANKLE SURGERY     CESAREAN SECTION WITH BILATERAL TUBAL LIGATION     ICD IMPLANT N/A 10/29/2021   Procedure: ICD IMPLANT;  Surgeon: Marinus Maw, MD;  Location: [facility] INVASIVE CV LAB;  Service: Cardiovascular;  Laterality: N/A;    Current Medications: No outpatient medications have been marked as taking for the 12/09/21 encounter (Appointment) with Lissa Hoard, Zachary George, NP.     Allergies:   Patient has no known allergies.   Social History   Socioeconomic History   Marital status: Single    Spouse name: Not on file   Number of children: Not on file   Years of education: Not on file   Highest education level: Not on file  Occupational History   Not on file  Tobacco Use   Smoking status: Never   Smokeless tobacco: Never  Vaping Use   Vaping Use: Never used  Substance and Sexual Activity   Alcohol use: Yes   Drug use: Not Currently   Sexual activity: Not on file  Other Topics Concern   Not on file  Social History Narrative   Not on file   Social Determinants of Health   Financial Resource Strain: Not on file  Food Insecurity: Not on file  Transportation Needs:  Not on file  Physical Activity: Not on file  Stress: Not on file  Social Connections: Not on file     Family History: The patient's ***family history includes Heart disease in her mother; Kidney disease in her father. She was adopted.  ROS:   Please see the history of present illness.    *** All other systems reviewed and are negative.  Labs/Other Studies Reviewed:    The following studies were reviewed today: ***  Recent Labs: 02/18/2021: TSH 2.122 02/21/2021: ALT 321; Magnesium 1.9 10/14/2021: Hemoglobin 16.3; Platelets 289 11/14/2021: BUN 23; Creatinine, Ser 0.90; Potassium 4.2; Sodium 138  Recent Lipid Panel    Component Value Date/Time   TRIG 78 04/09/2020 0427     Risk Assessment/Calculations:   {Does this patient have ATRIAL FIBRILLATION?:[phone]}       Physical Exam:    VS:  There were no vitals taken for this visit.    Wt Readings from Last 3 Encounters:  11/14/21 226 lb (102.5 kg)  10/29/21 220 lb (99.8 kg)  10/14/21 224 lb (101.6 kg)     GEN: *** Well nourished, well developed in no acute distress HEENT: Normal NECK: No JVD; No carotid bruits CARDIAC: ***RRR, no murmurs, rubs, gallops RESPIRATORY:  Clear to auscultation without rales, wheezing or rhonchi  ABDOMEN: Soft, non-tender, non-distended MUSCULOSKELETAL:  No edema; No deformity. *** pedal pulses, ***bilaterally SKIN: Warm and dry NEUROLOGIC:  Alert and oriented x 3 PSYCHIATRIC:  Normal affect   EKG:  EKG is *** ordered today.  The ekg ordered today demonstrates ***  Diagnoses:    No diagnosis found. Assessment and Plan:     ***          {Are you ordering a CV Procedure (e.g. stress test, cath, DCCV, TEE, etc)?   Press F2        :[id number]}    Medication Adjustments/Labs and Tests Ordered: Current medicines are reviewed at length with the patient today.  Concerns regarding medicines are outlined above.  No orders of the defined types were placed in this encounter.  No orders  of the defined types were placed in this encounter.   There are no Patient Instructions on file for this visit.   Signed, Levi Aland, NP  12/07/2021 9:02 AM    [facility]

## 2021-12-09 ENCOUNTER — Ambulatory Visit: Payer: 59 | Admitting: Nurse Practitioner

## 2021-12-09 ENCOUNTER — Telehealth: Payer: Self-pay | Admitting: *Deleted

## 2021-12-09 NOTE — Telephone Encounter (Signed)
-----   Message from Levi Aland, NP sent at 12/09/2021  5:53 AM EDT ----- Regarding: FW:  Please reschedule this pt to see Renee tomorrow.   ----- Message ----- From: Sheilah Pigeon, PA-C Sent: 12/08/2021   7:27 PM EDT To: Levi Aland, NP Subject: RE:                                            Yes, for sure! ----- Message ----- From: Levi Aland, NP Sent: 12/07/2021   9:06 AM EDT To: Sheilah Pigeon, PA-C  Hollace Kinnier,  This patient is on my schedule for Tues. Dr. Ladona Ridgel had asked to see her last week to check swelling of her implant site, however she must not have called back in time to get an appointment with him. You have an opening on Wednesday. I think that would be more appropriate fit for her. Do you agree?  Thanks! Marcelino Duster

## 2021-12-09 NOTE — Progress Notes (Deleted)
Cardiology Office Note Date:  12/09/2021  Patient ID:  Jo Knapp, DOB February 09, 1961, MRN [id number] PCP:  Ludwig Clarks, FNP  Cardiologist:  Dr. Radford Pax Electrophysiologist: Dr. Lovena Le  ***refresh   Chief Complaint: ***  History of Present Illness: Jo Knapp is a 61 y.o. female with history of NICM, AFib, chronic CHF (systolic), HTN.  She comes in today to be seen for Dr. Lovena Le, las seen by him at the time of her device implant 10/29/21.  She saw dr. Radford Pax 11/14/21, doing well, chronic DOE after COVID, no changes were made.  She calls with site discomfort and swelling and placed on my scheduled. She did have her wound check visit, 11/12/21, site was described as stable/well healed  *** site (pics look ok) *** volume *** meds, CM *** eliquis, bleeding, dose *** acute outputs *** AF burden   Device information Abbott dual chamber ICD implanted 10/29/21 Primary prevention  Past Medical History:  Diagnosis Date   Aortic atherosclerosis (HCC)    Ascending aorta dilatation (HCC)    66mm by Chest CTA 2/23   HFrEF (heart failure with reduced ejection fraction) ([place])    a. EF 25-30% by echo in 02/2021 and 30-35% on echo 09/2021 s/p AICD.  No CAD on coronary CTA   Hypertension    Paroxysmal A-fib ([place])    a. diagnosed in 02/2021    Past Surgical History:  Procedure Laterality Date   ANKLE SURGERY     CESAREAN SECTION WITH BILATERAL TUBAL LIGATION     ICD IMPLANT N/A 10/29/2021   Procedure: ICD IMPLANT;  Surgeon: Evans Lance, MD;  Location: [facility];  Service: Cardiovascular;  Laterality: N/A;    Current Outpatient Medications  Medication Sig Dispense Refill   acetaminophen (TYLENOL) 325 MG tablet Take 2 tablets (650 mg total) by mouth every 6 (six) hours as needed for mild pain (or Fever >/= 101). 12 tablet 0   apixaban (ELIQUIS) 5 MG TABS tablet Take 1 tablet (5 mg total) by mouth 2 (two) times daily. Start around 05/12/20 after completing initial  started pack 60 tablet 5   ascorbic acid (VITAMIN C) 500 MG tablet Take 1 tablet (500 mg total) by mouth daily. 30 tablet 5   atorvastatin (LIPITOR) 20 MG tablet Take 20 mg by mouth daily.     dapagliflozin propanediol (FARXIGA) 10 MG TABS tablet Take 1 tablet (10 mg total) by mouth daily before breakfast. For Heart 30 tablet 6   metoprolol succinate (TOPROL-XL) 50 MG 24 hr tablet Take 1 tablet (50 mg total) by mouth 2 (two) times daily. Take with or immediately following a meal. 60 tablet 6   Multiple Vitamins-Minerals (MULTIVITAMIN WITH MINERALS) tablet Take 1 tablet by mouth daily. Vita fusion     omeprazole (PRILOSEC) 20 MG capsule Take 1 capsule (20 mg total) by mouth daily. 30 capsule 3   potassium chloride (KLOR-CON) 10 MEQ tablet Take 1 tablet (10 mEq total) by mouth daily. Take While taking Lasix/furosemide 30 tablet 2   sacubitril-valsartan (ENTRESTO) 97-103 MG Take 1 tablet by mouth 2 (two) times daily. 180 tablet 3   senna (SENOKOT) 8.6 MG TABS tablet Take 1 tablet (8.6 mg total) by mouth daily as needed for mild constipation. 120 tablet 0   spironolactone (ALDACTONE) 25 MG tablet Take 0.5 tablets (12.5 mg total) by mouth daily. 30 tablet 5   zinc sulfate 220 (50 Zn) MG capsule Take 1 capsule (220 mg total) by mouth daily. 30 capsule 3  No current facility-administered medications for this visit.    Allergies:   Patient has no known allergies.   Social History:  The patient  reports that she has never smoked. She has never used smokeless tobacco. She reports current alcohol use. She reports that she does not currently use drugs.   Family History:  The patient's family history includes Heart disease in her mother; Kidney disease in her father. She was adopted.  ROS:  Please see the history of present illness.    All other systems are reviewed and otherwise negative.   PHYSICAL EXAM:  VS:  There were no vitals taken for this visit. BMI: There is no height or weight on file to  calculate BMI. Well nourished, well developed, in no acute distress HEENT: normocephalic, atraumatic Neck: no JVD, carotid bruits or masses Cardiac:  *** RRR; no significant murmurs, no rubs, or gallops Lungs:  *** CTA b/l, no wheezing, rhonchi or rales Abd: soft, nontender MS: no deformity or *** atrophy Ext: *** no edema Skin: warm and dry, no rash Neuro:  No gross deficits appreciated Psych: euthymic mood, full affect  *** ICD site is stable, no tethering or discomfort   EKG:  not done today  Device interrogation done today and reviewed by myself:  ***   Coronary CTA 08/21/2021 IMPRESSION: 1. No evidence of CAD, CADRADS = 0.   2. Coronary calcium score of 0. This was 0 percentile for age and sex matched control.   3. Normal coronary origin with right dominance.   4. Aorta: Borderline dilated at 38 mm at the mid ascending aorta (level of the PA bifurcation) measured double oblique. Aortic atherosclerosis. No dissection.   5. Dilated main pulmonary artery at 30 mm, suggestive of pulmonary hypertension.   6. Consider non-coronary causes of chest pain.   2D echo 09/22/2021 IMPRESSIONS    1. Left ventricular ejection fraction, by estimation, is 30 to 35%. The  left ventricle has moderately decreased function. The left ventricle  demonstrates global hypokinesis. The left ventricular internal cavity size  was moderately dilated. There is mild   concentric left ventricular hypertrophy. Left ventricular diastolic  parameters are consistent with Grade I diastolic dysfunction (impaired  relaxation).   2. Right ventricular systolic function mildly-to-moderately reduced. The  right ventricular size is normal. There is normal pulmonary artery  systolic pressure. The estimated right ventricular systolic pressure is  [id number] mmHg.   3. Left atrial size was moderately dilated.   4. Right atrial size was mildly dilated.   5. The mitral valve is grossly normal. Trivial mitral valve   regurgitation.   6. The aortic valve is tricuspid. Aortic valve regurgitation is not  visualized. Aortic valve sclerosis is present, with no evidence of aortic  valve stenosis.   7. Aortic dilatation noted. There is mild dilatation of the aortic root,  measuring 39 mm. There is mild dilatation of the ascending aorta,  measuring 40 mm.   8. The inferior vena cava is normal in size with greater than 50%  respiratory variability, suggesting right atrial pressure of 3 mmHg.   Comparison(s): Compared to prior TTE in 02/2021, the EF appears slightly  better at 30-35% (previously 25-30%).      Echocardiogram: 02/19/2021 IMPRESSIONS  1. Diffuse hypokinesis, worse in the inferior and inferoseptal walls. .  Left ventricular ejection fraction, by estimation, is 25 to 30%. The left  ventricle has severely decreased function. The left ventricular internal  cavity size was mildly dilated.  There  is mild left ventricular hypertrophy. Left ventricular diastolic  parameters are consistent with Grade II diastolic dysfunction  (pseudonormalization).   2. Right ventricular systolic function is moderately reduced. The right  ventricular size is normal. There is mildly elevated pulmonary artery  systolic pressure.   3. Left atrial size was mild to moderately dilated.   4. Right atrial size was mild to moderately dilated.   5. The mitral valve is normal in structure. Mild mitral valve  regurgitation.   6. The aortic valve is tricuspid. Aortic valve regurgitation is trivial.  Mild to moderate aortic valve sclerosis/calcification is present, without  any evidence of aortic stenosis.   7. There is mild dilatation of the ascending aorta, measuring 40 mm.   8. The inferior vena cava is dilated in size with <50% respiratory  variability, suggesting right atrial pressure of 15 mmHg.   Recent Labs: 02/18/2021: TSH 2.122 02/21/2021: ALT 321; Magnesium 1.9 10/14/2021: Hemoglobin 16.3; Platelets 289 11/14/2021:  BUN 23; Creatinine, Ser 0.90; Potassium 4.2; Sodium 138  No results found for requested labs within last 8760 hours.   CrCl cannot be calculated (Patient's most recent lab result is older than the maximum 21 days allowed.).   Wt Readings from Last 3 Encounters:  11/14/21 226 lb (102.5 kg)  10/29/21 220 lb (99.8 kg)  10/14/21 224 lb (101.6 kg)     Other studies reviewed: Additional studies/records reviewed today include: summarized above  ASSESSMENT AND PLAN:  ICD ***  NICM Chronic CHF ***  Paroxysmal Afib CHA2DS2Vasc is 3, on Eliquis, *** appropriately dosed *** % burden  Disposition: F/u with ***  Current medicines are reviewed at length with the patient today.  The patient did not have any concerns regarding medicines.  Venetia Night, PA-C 12/09/2021 6:59 PM     [address] [place] [address] [phone] (office)  [phone] (fax)

## 2021-12-09 NOTE — Telephone Encounter (Signed)
S/w pt is aware of new appt time and date with Francis Dowse.

## 2021-12-10 ENCOUNTER — Ambulatory Visit: Payer: 59 | Admitting: Physician Assistant

## 2022-01-21 ENCOUNTER — Encounter: Payer: Self-pay | Admitting: Internal Medicine

## 2022-01-21 ENCOUNTER — Ambulatory Visit (INDEPENDENT_AMBULATORY_CARE_PROVIDER_SITE_OTHER): Payer: 59 | Admitting: Internal Medicine

## 2022-01-21 VITALS — BP 160/96 | HR 76 | Ht 66.0 in | Wt 233.2 lb

## 2022-01-21 DIAGNOSIS — E669 Obesity, unspecified: Secondary | ICD-10-CM

## 2022-01-21 DIAGNOSIS — I480 Paroxysmal atrial fibrillation: Secondary | ICD-10-CM | POA: Diagnosis not present

## 2022-01-21 DIAGNOSIS — Z6837 Body mass index (BMI) 37.0-37.9, adult: Secondary | ICD-10-CM

## 2022-01-21 DIAGNOSIS — I5041 Acute combined systolic (congestive) and diastolic (congestive) heart failure: Secondary | ICD-10-CM

## 2022-01-21 DIAGNOSIS — I5022 Chronic systolic (congestive) heart failure: Secondary | ICD-10-CM

## 2022-01-21 MED ORDER — ENTRESTO 97-103 MG PO TABS: 1.0000 | ORAL_TABLET | Freq: Two times a day (BID) | ORAL | 3 refills | Status: DC

## 2022-01-21 MED ORDER — SPIRONOLACTONE 25 MG PO TABS
12.5000 mg | ORAL_TABLET | Freq: Every day | ORAL | 3 refills | Status: DC
Start: 2022-01-21 — End: 2023-04-21

## 2022-01-21 MED ORDER — APIXABAN 5 MG PO TABS: 5.0000 mg | ORAL_TABLET | Freq: Two times a day (BID) | ORAL | 11 refills | Status: AC

## 2022-01-21 MED ORDER — DAPAGLIFLOZIN PROPANEDIOL 10 MG PO TABS: 10.0000 mg | ORAL_TABLET | Freq: Every day | ORAL | 11 refills | Status: DC

## 2022-01-21 MED ORDER — OMEPRAZOLE 20 MG PO CPDR: 20.0000 mg | DELAYED_RELEASE_CAPSULE | Freq: Every day | ORAL | 3 refills | Status: DC

## 2022-01-21 MED ORDER — METOPROLOL SUCCINATE ER 50 MG PO TB24
50.0000 mg | ORAL_TABLET | Freq: Two times a day (BID) | ORAL | 11 refills | Status: DC
Start: 2022-01-21 — End: 2022-06-19

## 2022-01-21 MED ORDER — POTASSIUM CHLORIDE ER 10 MEQ PO TBCR: 10.0000 meq | EXTENDED_RELEASE_TABLET | Freq: Every day | ORAL | 11 refills | Status: AC

## 2022-01-21 MED ORDER — ATORVASTATIN CALCIUM 20 MG PO TABS: 20.0000 mg | ORAL_TABLET | Freq: Every day | ORAL | 3 refills | Status: DC

## 2022-01-21 NOTE — Progress Notes (Signed)
HPI Ms. Jo Knapp returns today for ongoing evaluation of a non-ischemic CM, s/p ICD insertion. She is a pleasant 61 yo woman with a non-ischemic CM, who has been treated with GDMT. She also has PAF and notes that she is out of rhythm 4-5 times a month, each lasting less than an hour. She denies syncope. No edema. No chest pain. She has class 2 CHF symptoms. Her ef has been 30% by echo.  she has run out of her meds and did not take them this morning. She admits to dietary indiscretion with sugar.   No Known Allergies   Current Outpatient Medications  Medication Sig Dispense Refill   acetaminophen (TYLENOL) 325 MG tablet Take 2 tablets (650 mg total) by mouth every 6 (six) hours as needed for mild pain (or Fever >/= 101). 12 tablet 0   apixaban (ELIQUIS) 5 MG TABS tablet Take 1 tablet (5 mg total) by mouth 2 (two) times daily. Start around 05/12/20 after completing initial started pack 60 tablet 5   atorvastatin (LIPITOR) 20 MG tablet Take 20 mg by mouth daily.     metoprolol succinate (TOPROL-XL) 50 MG 24 hr tablet Take 1 tablet (50 mg total) by mouth 2 (two) times daily. Take with or immediately following a meal. 60 tablet 6   Multiple Vitamins-Minerals (MULTIVITAMIN WITH MINERALS) tablet Take 1 tablet by mouth daily. Vita fusion     sacubitril-valsartan (ENTRESTO) 97-103 MG Take 1 tablet by mouth 2 (two) times daily. 180 tablet 3   spironolactone (ALDACTONE) 25 MG tablet Take 0.5 tablets (12.5 mg total) by mouth daily. 30 tablet 5   zinc sulfate 220 (50 Zn) MG capsule Take 1 capsule (220 mg total) by mouth daily. 30 capsule 3   dapagliflozin propanediol (FARXIGA) 10 MG TABS tablet Take 1 tablet (10 mg total) by mouth daily before breakfast. For Heart (Patient not taking: Reported on 01/21/2022) 30 tablet 6   omeprazole (PRILOSEC) 20 MG capsule Take 1 capsule (20 mg total) by mouth daily. (Patient not taking: Reported on 01/21/2022) 30 capsule 3   potassium chloride (KLOR-CON) 10 MEQ  tablet Take 1 tablet (10 mEq total) by mouth daily. Take While taking Lasix/furosemide (Patient not taking: Reported on 01/21/2022) 30 tablet 2   senna (SENOKOT) 8.6 MG TABS tablet Take 1 tablet (8.6 mg total) by mouth daily as needed for mild constipation. (Patient not taking: Reported on 01/21/2022) 120 tablet 0   No current facility-administered medications for this visit.     Past Medical History:  Diagnosis Date   Aortic atherosclerosis (HCC)    Ascending aorta dilatation (HCC)    77mm by Chest CTA 2/23   HFrEF (heart failure with reduced ejection fraction) (HCC)    a. EF 25-30% by echo in 02/2021 and 30-35% on echo 09/2021 s/p AICD.  No CAD on coronary CTA   Hypertension    Paroxysmal A-fib (HCC)    a. diagnosed in 02/2021    ROS:   All systems reviewed and negative except as noted in the HPI.   Past Surgical History:  Procedure Laterality Date   ANKLE SURGERY     CESAREAN SECTION WITH BILATERAL TUBAL LIGATION     ICD IMPLANT N/A 10/29/2021   Procedure: ICD IMPLANT;  Surgeon: Marinus Maw, MD;  Location: [facility] INVASIVE CV LAB;  Service: Cardiovascular;  Laterality: N/A;     Family History  Adopted: Yes  Problem Relation Age of Onset   Heart disease Mother  Kidney disease Father      Social History   Socioeconomic History   Marital status: Single    Spouse name: Not on file   Number of children: Not on file   Years of education: Not on file   Highest education level: Not on file  Occupational History   Not on file  Tobacco Use   Smoking status: Never   Smokeless tobacco: Never  Vaping Use   Vaping Use: Never used  Substance and Sexual Activity   Alcohol use: Yes   Drug use: Not Currently   Sexual activity: Not on file  Other Topics Concern   Not on file  Social History Narrative   Not on file   Social Determinants of Health   Financial Resource Strain: Not on file  Food Insecurity: Not on file  Transportation Needs: Not on file  Physical  Activity: Not on file  Stress: Not on file  Social Connections: Not on file  Intimate Partner Violence: Not on file     BP (!) 160/96   Pulse 76   Ht 5\' 6"  (1.676 m)   Wt 233 lb 3.2 oz (105.8 kg)   SpO2 96%   BMI 37.64 kg/m   Physical Exam:  Well appearing obese woman, NAD HEENT: Unremarkable Neck:  No JVD, no thyromegally Lymphatics:  No adenopathy Back:  No CVA tenderness Lungs:  Clear with no wheezes HEART:  Regular rate rhythm, no murmurs, no rubs, no clicks Abd:  soft, positive bowel sounds, no organomegally, no rebound, no guarding Ext:  2 plus pulses, no edema, no cyanosis, no clubbing Skin:  No rashes no nodules Neuro:  CN II through XII intact, motor grossly intact  DEVICE  Normal device function.  See PaceArt for details.   Assess/Plan:  Chronic systolic heart failure- her symptoms remain class 2. She will continue her current meds. PAF - she is maintaining NSR.  Obesity - we discussed avoidance of sugar sweetened beverages and intermittant fastin. ICD - her St. Jude DDD ICD is working normally. We will follow.  Geralda Baumgardner,MD

## 2022-01-21 NOTE — Patient Instructions (Signed)
Medication Instructions:  Your physician recommends that you continue on your current medications as directed. Please refer to the Current Medication list given to you today.  *If you need a refill on your cardiac medications before your next appointment, please call your pharmacy*   Lab Work: NONE   If you have labs (blood work) drawn today and your tests are completely normal, you will receive your results only by: MyChart Message (if you have MyChart) OR A paper copy in the mail If you have any lab test that is abnormal or we need to change your treatment, we will call you to review the results.   Testing/Procedures: NONE    Follow-Up: At [facility], you and your health needs are our priority.  As part of our continuing mission to provide you with exceptional heart care, we have created designated Provider Care Teams.  These Care Teams include your primary Cardiologist (physician) and Advanced Practice Providers (APPs -  Physician Assistants and Nurse Practitioners) who all work together to provide you with the care you need, when you need it.  We recommend signing up for the patient portal called "MyChart".  Sign up information is provided on this After Visit Summary.  MyChart is used to connect with patients for Virtual Visits (Telemedicine).  Patients are able to view lab/test results, encounter notes, upcoming appointments, etc.  Non-urgent messages can be sent to your provider as well.   To learn more about what you can do with MyChart, go to [email or web address].    Your next appointment:   1 year(s)  The format for your next appointment:   In Person  Provider:   Gregg Taylor, MD    Other Instructions Thank you for choosing [facility]!    Important Information About Sugar       

## 2022-02-02 ENCOUNTER — Ambulatory Visit (INDEPENDENT_AMBULATORY_CARE_PROVIDER_SITE_OTHER): Payer: 59

## 2022-02-02 DIAGNOSIS — Z4502 Encounter for adjustment and management of automatic implantable cardiac defibrillator: Secondary | ICD-10-CM | POA: Diagnosis not present

## 2022-02-02 DIAGNOSIS — I428 Other cardiomyopathies: Secondary | ICD-10-CM | POA: Diagnosis not present

## 2022-02-04 LAB — CUP PACEART REMOTE DEVICE CHECK
Battery Remaining Longevity: 91 mo
Battery Remaining Percentage: 95 %
Battery Voltage: 3.2 V
Brady Statistic AP VP Percent: 1 %
Brady Statistic AP VS Percent: 5.9 %
Brady Statistic AS VP Percent: 1 %
Brady Statistic AS VS Percent: 91 %
Brady Statistic RA Percent Paced: 2.7 %
Brady Statistic RV Percent Paced: 1 %
Date Time Interrogation Session: 20230725112718
HighPow Impedance: 81 Ohm
HighPow Impedance: 81 Ohm
Implantable Lead Implant Date: 20230419
Implantable Lead Implant Date: 20230419
Implantable Lead Location: 753862
Implantable Lead Location: 753862
Implantable Lead Model: 7122
Implantable Pulse Generator Implant Date: 20230419
Lead Channel Impedance Value: 430 Ohm
Lead Channel Impedance Value: 650 Ohm
Lead Channel Pacing Threshold Amplitude: 0.5 V
Lead Channel Pacing Threshold Amplitude: 0.75 V
Lead Channel Pacing Threshold Pulse Width: 0.5 ms
Lead Channel Pacing Threshold Pulse Width: 0.5 ms
Lead Channel Sensing Intrinsic Amplitude: 11.8 mV
Lead Channel Sensing Intrinsic Amplitude: 4.7 mV
Lead Channel Setting Pacing Amplitude: 2 V
Lead Channel Setting Pacing Amplitude: 2.5 V
Lead Channel Setting Pacing Pulse Width: 0.5 ms
Lead Channel Setting Sensing Sensitivity: 0.5 mV
Pulse Gen Serial Number: 8939155

## 2022-03-06 NOTE — Progress Notes (Signed)
Remote ICD transmission.   

## 2022-05-04 ENCOUNTER — Ambulatory Visit (INDEPENDENT_AMBULATORY_CARE_PROVIDER_SITE_OTHER): Payer: Self-pay

## 2022-05-04 DIAGNOSIS — I428 Other cardiomyopathies: Secondary | ICD-10-CM

## 2022-05-05 LAB — CUP PACEART REMOTE DEVICE CHECK
Battery Remaining Longevity: 88 mo
Battery Remaining Percentage: 94 %
Battery Voltage: 3.19 V
Brady Statistic AP VP Percent: 1 %
Brady Statistic AP VS Percent: 6.4 %
Brady Statistic AS VP Percent: 1 %
Brady Statistic AS VS Percent: 90 %
Brady Statistic RA Percent Paced: 1.8 %
Brady Statistic RV Percent Paced: 1 %
Date Time Interrogation Session: 20231023020014
HighPow Impedance: 81 Ohm
HighPow Impedance: 81 Ohm
Implantable Lead Connection Status: 753985
Implantable Lead Connection Status: 753985
Implantable Lead Implant Date: 20230419
Implantable Lead Implant Date: 20230419
Implantable Lead Location: 753862
Implantable Lead Location: 753862
Implantable Lead Model: 7122
Implantable Pulse Generator Implant Date: 20230419
Lead Channel Impedance Value: 400 Ohm
Lead Channel Impedance Value: 400 Ohm
Lead Channel Pacing Threshold Amplitude: 0.5 V
Lead Channel Pacing Threshold Amplitude: 0.75 V
Lead Channel Pacing Threshold Pulse Width: 0.5 ms
Lead Channel Pacing Threshold Pulse Width: 0.5 ms
Lead Channel Sensing Intrinsic Amplitude: 11.2 mV
Lead Channel Sensing Intrinsic Amplitude: 3.4 mV
Lead Channel Setting Pacing Amplitude: 2 V
Lead Channel Setting Pacing Amplitude: 2.5 V
Lead Channel Setting Pacing Pulse Width: 0.5 ms
Lead Channel Setting Sensing Sensitivity: 0.5 mV
Pulse Gen Serial Number: 8939155

## 2022-05-27 NOTE — Progress Notes (Signed)
Remote ICD transmission.   

## 2022-06-19 ENCOUNTER — Telehealth: Payer: Self-pay | Admitting: *Deleted

## 2022-06-19 ENCOUNTER — Ambulatory Visit: Payer: Medicaid Other | Attending: Internal Medicine | Admitting: Internal Medicine

## 2022-06-19 ENCOUNTER — Telehealth: Payer: Self-pay

## 2022-06-19 ENCOUNTER — Encounter: Payer: Self-pay | Admitting: Internal Medicine

## 2022-06-19 VITALS — BP 140/84 | HR 76 | Ht 66.0 in | Wt 248.0 lb

## 2022-06-19 DIAGNOSIS — Z95810 Presence of automatic (implantable) cardiac defibrillator: Secondary | ICD-10-CM

## 2022-06-19 DIAGNOSIS — G4733 Obstructive sleep apnea (adult) (pediatric): Secondary | ICD-10-CM

## 2022-06-19 DIAGNOSIS — I429 Cardiomyopathy, unspecified: Secondary | ICD-10-CM

## 2022-06-19 DIAGNOSIS — R0683 Snoring: Secondary | ICD-10-CM

## 2022-06-19 MED ORDER — ISOSORB DINITRATE-HYDRALAZINE 20-37.5 MG PO TABS: 1.0000 | ORAL_TABLET | Freq: Three times a day (TID) | ORAL | 3 refills | Status: DC

## 2022-06-19 MED ORDER — METOPROLOL TARTRATE 50 MG PO TABS: 50.0000 mg | ORAL_TABLET | Freq: Two times a day (BID) | ORAL | 3 refills | Status: DC

## 2022-06-19 MED ORDER — FUROSEMIDE 20 MG PO TABS: 20.0000 mg | ORAL_TABLET | Freq: Every day | ORAL | 3 refills | Status: DC

## 2022-06-19 NOTE — Addendum Note (Signed)
Addended by: Reesa Chew on: 06/19/2022 03:24 PM   Modules accepted: Orders

## 2022-06-19 NOTE — Addendum Note (Signed)
Addended by: Leonides Schanz C on: 06/19/2022 03:14 PM   Modules accepted: Orders

## 2022-06-19 NOTE — Progress Notes (Signed)
Cardiology Office Note  Date: 06/19/2022   ID: Jo Knapp, [place] 03-Sep-1960, MRN [id number]  PCP:  Oneal Grout, FNP  Cardiologist:  None Electrophysiologist:  None   Reason for Office Visit: NICM follow-up   History of Present Illness: Jo Knapp is a 61 y.o. female known to have NICM LVEF 30 to 35% in 3/23 s/p Saint Jude dual-chamber ICD, paroxysmal A-fib (newly diagnosed in 02/2021), history of PE (diagnosed in 04/2020 in setting of COVID-19 infection), HTN presented to the cardiology clinic for follow-up visit.  Patient reported having DOE for the last few months associated with minimal LE swelling.  She gained 30 pounds.  Denied any dizziness/lightness, palpitations, syncope.  She has OSA symptoms like snoring, feeling choked at nighttime, morning headaches and labs in the morning.  Past Medical History:  Diagnosis Date   Aortic atherosclerosis (HCC)    Ascending aorta dilatation (HCC)    73mm by Chest CTA 2/23   HFrEF (heart failure with reduced ejection fraction) (HCC)    a. EF 25-30% by echo in 02/2021 and 30-35% on echo 09/2021 s/p AICD.  No CAD on coronary CTA   Hypertension    Paroxysmal A-fib (HCC)    a. diagnosed in 02/2021    Past Surgical History:  Procedure Laterality Date   ANKLE SURGERY     CESAREAN SECTION WITH BILATERAL TUBAL LIGATION     ICD IMPLANT N/A 10/29/2021   Procedure: ICD IMPLANT;  Surgeon: Marinus Maw, MD;  Location: [facility] INVASIVE CV LAB;  Service: Cardiovascular;  Laterality: N/A;    Current Outpatient Medications  Medication Sig Dispense Refill   acetaminophen (TYLENOL) 325 MG tablet Take 2 tablets (650 mg total) by mouth every 6 (six) hours as needed for mild pain (or Fever >/= 101). 12 tablet 0   apixaban (ELIQUIS) 5 MG TABS tablet Take 1 tablet (5 mg total) by mouth 2 (two) times daily. Start around 05/12/20 after completing initial started pack 60 tablet 11   atorvastatin (LIPITOR) 20 MG tablet Take 1 tablet (20 mg total) by  mouth daily. 90 tablet 3   dapagliflozin propanediol (FARXIGA) 10 MG TABS tablet Take 1 tablet (10 mg total) by mouth daily before breakfast. For Heart 30 tablet 11   metoprolol succinate (TOPROL-XL) 50 MG 24 hr tablet Take 1 tablet (50 mg total) by mouth 2 (two) times daily. Take with or immediately following a meal. 60 tablet 11   Multiple Vitamins-Minerals (MULTIVITAMIN WITH MINERALS) tablet Take 1 tablet by mouth daily. Vita fusion     omeprazole (PRILOSEC) 20 MG capsule Take 1 capsule (20 mg total) by mouth daily. 90 capsule 3   potassium chloride (KLOR-CON) 10 MEQ tablet Take 1 tablet (10 mEq total) by mouth daily. Take While taking Lasix/furosemide 30 tablet 11   sacubitril-valsartan (ENTRESTO) 97-103 MG Take 1 tablet by mouth 2 (two) times daily. 180 tablet 3   senna (SENOKOT) 8.6 MG TABS tablet Take 1 tablet (8.6 mg total) by mouth daily as needed for mild constipation. (Patient not taking: Reported on 01/21/2022) 120 tablet 0   spironolactone (ALDACTONE) 25 MG tablet Take 0.5 tablets (12.5 mg total) by mouth daily. 45 tablet 3   zinc sulfate 220 (50 Zn) MG capsule Take 1 capsule (220 mg total) by mouth daily. 30 capsule 3   No current facility-administered medications for this visit.   Allergies:  Patient has no known allergies.   Social History: The patient  reports that she has never smoked. She has  never used smokeless tobacco. She reports current alcohol use. She reports that she does not currently use drugs.   Family History: The patient's family history includes Heart disease in her mother; Kidney disease in her father. She was adopted.   ROS:  Please see the history of present illness. Otherwise, complete review of systems is positive for none.  All other systems are reviewed and negative.   Physical Exam: VS:  Ht 5\' 6"  (1.676 m)   Wt 248 lb (112.5 kg)   BMI 40.03 kg/m , BMI Body mass index is 40.03 kg/m.  Wt Readings from Last 3 Encounters:  06/19/22 248 lb (112.5 kg)   01/21/22 233 lb 3.2 oz (105.8 kg)  11/14/21 226 lb (102.5 kg)    General: Patient appears comfortable at rest. HEENT: Conjunctiva and lids normal, oropharynx clear with moist mucosa. Neck: Supple, no elevated JVP or carotid bruits, no thyromegaly. Lungs: Clear to auscultation, nonlabored breathing at rest. Cardiac: Regular rate and rhythm, no S3 or significant systolic murmur, no pericardial rub. Abdomen: Soft, nontender, no hepatomegaly, bowel sounds present, no guarding or rebound. Extremities: No pitting edema, distal pulses 2+. Skin: Warm and dry. Musculoskeletal: No kyphosis. Neuropsychiatric: Alert and oriented x3, affect grossly appropriate.  ECG: Normal sinus rhythm and no ST-T changes  Recent Labwork: 10/14/2021: Hemoglobin 16.3; Platelets 289 11/14/2021: BUN 23; Creatinine, Ser 0.90; Potassium 4.2; Sodium 138     Component Value Date/Time   TRIG 78 04/09/2020 0427    Other Studies Reviewed Today: Echo from 09/2021 LVEF 30 to 35% Mild LVH Grade 1 diastolic function RV systolic function mild to moderately reduced LA moderately dilated RA mildly dilated Trivial MR Mild dilatation of the aortic root 39 mm and mild dilation of the ascending aorta, 40 mm  CT cardiac in 08/2021 Notice of CAD, CAD RADS 0 Coronary calcium score of 0 Borderline dilated mid ascending aorta 38 mm  Assessment and Plan: Patient is a 61 year old F known to have NICM LVEF 30 to 35% in 3/23 s/p Saint Jude dual-chamber ICD, paroxysmal A-fib (newly diagnosed in 02/2021), history of PE (diagnosed in 04/2020 in setting of COVID-19 infection), HTN presented to the cardiology clinic for follow-up visit.  # NICM LVEF 30 to 35% s/p Saint Jude dual-chamber ICD, likely secondary to poorly controlled HTN -Start Lasix 20 mg once daily. Can take additional dose of Lasix as needed for SOB/LE swelling. Repeat BMP in 1 week.  Will obtain limited 2D echocardiogram for LVEF assessment. -Switch metoprolol succinate to  tartrate 50 mg twice daily -Continue Entresto 97-103 mg twice daily -Continue spironolactone 25 mg once daily -Start BiDil 20-37.5 mg 3 times daily -Continue Farxiga 10 mg once daily  # HTN, poorly controlled -Blood pressures at home ranged from 160 to 180 mmHg SBP.  Instructed patient to check her blood pressures 2 times, a.m. and p.m. -Start Lasix 20 mg once daily (can take additional dose for SOB/LE swelling), switch metoprolol succinate to tartrate 50 mg twice daily, continue Entresto 97-103 mg twice daily, continue spironolactone 25 mg once daily, continue Farxiga 10 mg once daily and start BiDil 20-37.5 mg 3 times a day.  # s/p Saint Jude dual-chamber ICD -Functioning well. Follow-up with EP for remote device interrogations.  # Paroxysmal A-fib -Switch metoprolol succinate to tartrate 50 mg twice daily -Continue Eliquis 5 mg twice daily  # Borderline dilatation of mid ascending aorta, 38 mm per CT cardiac in 2023 -Imaging surveillance of aortic dilatation every 3 years.  Next CT  imaging in 2026.  # OSA screening -Patient has OSA symptoms like snoring, choking at nighttime, morning headaches, morning labs etc.  She will benefit from OSA evaluation with WatchPAT testing.  If WatchPAT testing is negative for OSA, I will refer her to pulmonology for lab sleep study.  I have spent a total of 36 minutes with patient reviewing chart, EKGs, labs and examining patient as well as establishing an assessment and plan that was discussed with the patient.  > 50% of time was spent in direct patient care.     Medication Adjustments/Labs and Tests Ordered: Current medicines are reviewed at length with the patient today.  Concerns regarding medicines are outlined above.   Tests Ordered: No orders of the defined types were placed in this encounter.   Medication Changes: No orders of the defined types were placed in this encounter.   Disposition:  Follow up in 4 weeks for telephone appointment  and 6 months for in person appointment  Signed, Jadin Creque Verne Spurr, MD, 06/19/2022 1:23 PM    [facility] at [facility] [address] [place], [place] [address]

## 2022-06-19 NOTE — Telephone Encounter (Signed)
Sleep Apnea Evaluation  [facility]  Today's Date: 06/19/2022   Patient Name: Jo Knapp        DOB: [birth year]       Height:        Weight:    BMI: There is no height or weight on file to calculate BMI.    Referring Provider:  Luane School, MD   STOP-BANG RISK ASSESSMENT         If STOP-BANG Score ?3 OR two clinical symptoms - patient qualifies for WatchPAT (CPT 95800)      Sleep study ordered due to two (2) of the following clinical symptoms/diagnoses:  Excessive daytime sleepiness G47.10  Gastroesophageal reflux K21.9  Nocturia R35.1  Morning Headaches G44.221  Difficulty concentrating R41.840  Memory problems or poor judgment G31.84  Personality changes or irritability R45.4  Loud snoring R06.83  Depression F32.9  Unrefreshed by sleep G47.8  Impotence N52.9  History of high blood pressure R03.0  Insomnia G47.00  Sleep Disordered Breathing or Sleep Apnea ICD G47.33

## 2022-06-19 NOTE — Telephone Encounter (Signed)
Prior Authorization for  sent to  via web portal. Tracking Number .

## 2022-06-19 NOTE — Addendum Note (Signed)
Addended by: Leonides Schanz C on: 06/19/2022 03:00 PM   Modules accepted: Orders

## 2022-06-19 NOTE — Patient Instructions (Signed)
Medication Instructions:  Your physician has recommended you make the following change in your medication:  - Start Lasix 20 mg tablets once daily -Start Bidil 20-37.5 mg tablets three times daily -Start Metoprolol Tartrate 50 mg tablets twice daily   Testing/Procedures: BMET Your physician has requested that you have an echocardiogram. Echocardiography is a painless test that uses sound waves to create images of your heart. It provides your doctor with information about the size and shape of your heart and how well your heart's chambers and valves are working. This procedure takes approximately one hour. There are no restrictions for this procedure. Please do NOT wear cologne, perfume, aftershave, or lotions (deodorant is allowed). Please arrive 15 minutes prior to your appointment time.   Follow-Up: Follow up with Dr. Jenene Slicker in 4 weeks- Virtual Visit Follow up with Dr. Jenene Slicker in 6 months- In Person  Any Other Special Instructions Will Be Listed Below (If Applicable).     If you need a refill on your cardiac medications before your next appointment, please call your pharmacy.

## 2022-06-22 ENCOUNTER — Ambulatory Visit (HOSPITAL_COMMUNITY): Admission: RE | Admit: 2022-06-22 | Payer: No Typology Code available for payment source | Source: Ambulatory Visit

## 2022-06-22 NOTE — Telephone Encounter (Signed)
[place] PT. I will forward to [facility] triage for the Itamar study.

## 2022-06-22 NOTE — Telephone Encounter (Signed)
Prior Authorization for ITAMAR sent to [employer] via web portal. Tracking Number . READY- NO PA REQ 

## 2022-06-22 NOTE — Telephone Encounter (Signed)
Left a message for patient with pin #.

## 2022-07-17 ENCOUNTER — Ambulatory Visit: Payer: Medicaid Other | Admitting: Internal Medicine

## 2022-07-17 NOTE — Progress Notes (Deleted)
Virtual Visit via Telephone Note   Because of Jo Knapp's co-morbid illnesses, she is at least at moderate risk for complications without adequate follow up.  This format is felt to be most appropriate for this patient at this time.  The patient did not have access to video technology/had technical difficulties with video requiring transitioning to audio format only (telephone).  All issues noted in this document were discussed and addressed.  No physical exam could be performed with this format.  Please refer to the patient's chart for her consent to telehealth for [facility].    Date:  07/17/2022   ID:  Jo Knapp, [place] 1961/06/08, MRN [id number] The patient was identified using 2 identifiers.  Patient Location: Home Provider Location: Office/Clinic   PCP:  Ludwig Clarks, [facility] [facility] Cardiologist:  Chalmers Guest, MD { Click to update primary MD,subspecialty MD or APP then REFRESH:1}    Evaluation Performed:  Follow-Up Visit  Chief Complaint:    History of Present Illness:    Jo Knapp is a 62 y.o. female with    Past Medical History:  Diagnosis Date   Aortic atherosclerosis ([place])    Ascending aorta dilatation ([place])    42mm by Chest CTA 2/23   HFrEF (heart failure with reduced ejection fraction) ([place])    a. EF 25-30% by echo in 02/2021 and 30-35% on echo 09/2021 s/p AICD.  No CAD on coronary CTA   Hypertension    Paroxysmal A-fib ([place])    a. diagnosed in 02/2021   Past Surgical History:  Procedure Laterality Date   ANKLE SURGERY     CESAREAN SECTION WITH BILATERAL TUBAL LIGATION     ICD IMPLANT N/A 10/29/2021   Procedure: ICD IMPLANT;  Surgeon: Evans Lance, MD;  Location: [place] CV LAB;  Service: Cardiovascular;  Laterality: N/A;     No outpatient medications have been marked as taking for the 07/17/22 encounter (Appointment) with Lenetta Piche, Quenten Raven, MD.     Allergies:   Patient has no known  allergies.   Social History   Tobacco Use   Smoking status: Never   Smokeless tobacco: Never  Vaping Use   Vaping Use: Never used  Substance Use Topics   Alcohol use: Yes   Drug use: Not Currently     Family Hx: The patient's family history includes Heart disease in her mother; Kidney disease in her father. She was adopted.  ROS:   Please see the history of present illness.    All other systems reviewed and are negative.   Prior CV studies:   The following studies were reviewed today:    Labs/Other Tests and Data Reviewed:    EKG:    Recent Labs: 10/14/2021: Hemoglobin 16.3; Platelets 289 11/14/2021: BUN 23; Creatinine, Ser 0.90; Potassium 4.2; Sodium 138   Recent Lipid Panel Lab Results  Component Value Date/Time   TRIG 78 04/09/2020 04:27 AM    Wt Readings from Last 3 Encounters:  06/19/22 248 lb (112.5 kg)  01/21/22 233 lb 3.2 oz (105.8 kg)  11/14/21 226 lb (102.5 kg)     Risk Assessment/Calculations:    CHA2DS2-VASc Score =    {Confirm score is correct.  If not, click here to update score.  REFRESH note.  :1} This indicates a  % annual risk of stroke. The patient's score is based upon:    {This patient has a significant risk of stroke if diagnosed with atrial fibrillation.  Please consider VKA or DOAC agent for anticoagulation if the bleeding risk is acceptable.   You can also use the SmartPhrase .[email or web address] for documentation.   :[id number]}  STOP-Bang Score:  5  { Consider Dx Sleep Disordered Breathing or Sleep Apnea  ICD G47.33          :1}    Objective:    Vital Signs:  There were no vitals taken for this visit.   {[facility] Virtual Exam (Optional):[phone]::"VITAL SIGNS:  reviewed"}  ASSESSMENT & PLAN:               Time:   Today, I have spent 10 minutes with the patient with telehealth technology discussing the above problems.     Medication Adjustments/Labs and Tests Ordered: Current medicines are reviewed at length with the  patient today.  Concerns regarding medicines are outlined above.   Tests Ordered: No orders of the defined types were placed in this encounter.   Medication Changes: No orders of the defined types were placed in this encounter.   Follow Up:  In Person  3 months  Signed, Chalmers Guest, MD  07/17/2022 11:19 AM    [place]

## 2022-07-26 NOTE — Progress Notes (Signed)
Erroneous encounter. Please disregard.

## 2022-08-03 ENCOUNTER — Ambulatory Visit: Payer: Medicaid Other | Attending: Internal Medicine

## 2022-08-03 DIAGNOSIS — I429 Cardiomyopathy, unspecified: Secondary | ICD-10-CM | POA: Diagnosis not present

## 2022-08-05 LAB — CUP PACEART REMOTE DEVICE CHECK
Date Time Interrogation Session: 20240123134505
Implantable Lead Connection Status: 753985
Implantable Lead Connection Status: 753985
Implantable Lead Implant Date: 20230419
Implantable Lead Implant Date: 20230419
Implantable Lead Location: 753862
Implantable Lead Location: 753862
Implantable Lead Model: 7122
Implantable Pulse Generator Implant Date: 20230419
Pulse Gen Serial Number: 8939155

## 2022-09-18 NOTE — Progress Notes (Signed)
Remote ICD transmission.   

## 2022-09-22 ENCOUNTER — Telehealth: Payer: Self-pay | Admitting: Internal Medicine

## 2022-09-22 NOTE — Telephone Encounter (Signed)
*  STAT* If patient is at the pharmacy, call can be transferred to refill team.   1. Which medications need to be refilled? (please list name of each medication and dose if known)   sacubitril-valsartan (ENTRESTO) 97-103 MG   2. Which pharmacy/location (including street and city if local pharmacy) is medication to be sent to?  [place], [place]   3. Do they need a 30 day or 90 day supply?   90 day  Patient states she is completely out of this medication.  Patient states her new insurance will need approval for her to have this medication not the generic version.

## 2022-09-22 NOTE — Telephone Encounter (Signed)
Patient notified that prior authorization was sent to her insurance today and [facility] is waiting for response. Patient verbalized understanding.

## 2022-09-22 NOTE — Telephone Encounter (Signed)
Request Reference Number: [id number]. ENTRESTO TAB 97-'103MG'$  is approved through 09/22/2023

## 2022-09-22 NOTE — Telephone Encounter (Signed)
Pharmacy notified and verbalized understanding of approval.

## 2022-09-22 NOTE — Telephone Encounter (Signed)
Patient notified and verbalized understanding of PA approval.

## 2022-10-21 ENCOUNTER — Encounter: Payer: Self-pay | Admitting: Internal Medicine

## 2022-10-21 ENCOUNTER — Ambulatory Visit: Payer: Medicaid Other | Attending: Internal Medicine | Admitting: Internal Medicine

## 2022-10-21 VITALS — BP 144/86 | HR 45 | Ht 66.0 in | Wt 253.0 lb

## 2022-10-21 DIAGNOSIS — I10 Essential (primary) hypertension: Secondary | ICD-10-CM

## 2022-10-21 DIAGNOSIS — I5020 Unspecified systolic (congestive) heart failure: Secondary | ICD-10-CM | POA: Diagnosis not present

## 2022-10-21 DIAGNOSIS — I429 Cardiomyopathy, unspecified: Secondary | ICD-10-CM

## 2022-10-21 MED ORDER — CARVEDILOL 12.5 MG PO TABS: 12.5000 mg | ORAL_TABLET | Freq: Two times a day (BID) | ORAL | 3 refills | Status: DC

## 2022-10-21 NOTE — Progress Notes (Signed)
Cardiology Office Note  Date: 10/21/2022   ID: Jo Knapp, [place] Jun 08, 1961, MRN [id number]  PCP:  Oneal Grout, FNP  Cardiologist:  Marjo Bicker, MD Electrophysiologist:  None   Reason for Office Visit: NICM follow-up   History of Present Illness: Jo Knapp is a 62 y.o. female known to have NICM LVEF 30 to 35% in 3/23 s/p Saint Jude dual-chamber ICD, paroxysmal A-fib (newly diagnosed in 02/2021), history of PE (diagnosed in 04/2020 in setting of COVID-19 infection), HTN presented to the cardiology clinic for follow-up visit.  Patient had elevated blood pressures between 160 and 180 mmHg, the last visit antihypertensive medications were changed and after that her blood pressure significantly improved, currently between 140 to 150 mmHg. She continues to have DOE but no recent worsening, no leg swelling, no palpitations or dizziness or syncope. She has OSA symptoms like snoring, feeling choked at nighttime, morning headaches etc. WatchPAT testing was recommended, approved but patient did not wear the watch yet. It's still sitting on the table at her home.  Past Medical History:  Diagnosis Date   Aortic atherosclerosis    Ascending aorta dilatation    42mm by Chest CTA 2/23   HFrEF (heart failure with reduced ejection fraction)    a. EF 25-30% by echo in 02/2021 and 30-35% on echo 09/2021 s/p AICD.  No CAD on coronary CTA   Hypertension    Paroxysmal A-fib    a. diagnosed in 02/2021    Past Surgical History:  Procedure Laterality Date   ANKLE SURGERY     CESAREAN SECTION WITH BILATERAL TUBAL LIGATION     ICD IMPLANT N/A 10/29/2021   Procedure: ICD IMPLANT;  Surgeon: Marinus Maw, MD;  Location: [facility] INVASIVE CV LAB;  Service: Cardiovascular;  Laterality: N/A;    Current Outpatient Medications  Medication Sig Dispense Refill   acetaminophen (TYLENOL) 325 MG tablet Take 2 tablets (650 mg total) by mouth every 6 (six) hours as needed for mild pain (or Fever >/=  101). 12 tablet 0   apixaban (ELIQUIS) 5 MG TABS tablet Take 1 tablet (5 mg total) by mouth 2 (two) times daily. Start around 05/12/20 after completing initial started pack 60 tablet 11   atorvastatin (LIPITOR) 20 MG tablet Take 1 tablet (20 mg total) by mouth daily. 90 tablet 3   dapagliflozin propanediol (FARXIGA) 10 MG TABS tablet Take 1 tablet (10 mg total) by mouth daily before breakfast. For Heart 30 tablet 11   furosemide (LASIX) 20 MG tablet Take 1 tablet (20 mg total) by mouth daily. 90 tablet 3   isosorbide-hydrALAZINE (BIDIL) 20-37.5 MG tablet Take 1 tablet by mouth 3 (three) times daily. 270 tablet 3   metoprolol tartrate (LOPRESSOR) 50 MG tablet Take 1 tablet (50 mg total) by mouth 2 (two) times daily. 180 tablet 3   omeprazole (PRILOSEC) 20 MG capsule Take 1 capsule (20 mg total) by mouth daily. 90 capsule 3   sacubitril-valsartan (ENTRESTO) 97-103 MG Take 1 tablet by mouth 2 (two) times daily. 180 tablet 3   spironolactone (ALDACTONE) 25 MG tablet Take 0.5 tablets (12.5 mg total) by mouth daily. 45 tablet 3   albuterol (VENTOLIN HFA) 108 (90 Base) MCG/ACT inhaler Inhale 2 puffs into the lungs every 4 (four) hours as needed.     fluticasone (FLONASE) 50 MCG/ACT nasal spray      Multiple Vitamins-Minerals (MULTIVITAMIN WITH MINERALS) tablet Take 1 tablet by mouth daily. Vita fusion (Patient not taking: Reported on 10/21/2022)  potassium chloride (KLOR-CON) 10 MEQ tablet Take 1 tablet (10 mEq total) by mouth daily. Take While taking Lasix/furosemide (Patient not taking: Reported on 10/21/2022) 30 tablet 11   promethazine-dextromethorphan (PROMETHAZINE-DM) 6.25-15 MG/5ML syrup TAKE BY MOUTH EVERY 4 HOURS FOR 10 DAYS     senna (SENOKOT) 8.6 MG TABS tablet Take 1 tablet (8.6 mg total) by mouth daily as needed for mild constipation. (Patient not taking: Reported on 01/21/2022) 120 tablet 0   zinc sulfate 220 (50 Zn) MG capsule Take 1 capsule (220 mg total) by mouth daily. (Patient not  taking: Reported on 10/21/2022) 30 capsule 3   No current facility-administered medications for this visit.   Allergies:  Patient has no known allergies.   Social History: The patient  reports that she has never smoked. She has never used smokeless tobacco. She reports current alcohol use. She reports that she does not currently use drugs.   Family History: The patient's family history includes Heart disease in her mother; Kidney disease in her father. She was adopted.   ROS:  Please see the history of present illness. Otherwise, complete review of systems is positive for none.  All other systems are reviewed and negative.   Physical Exam: VS:  BP (!) 144/86   Pulse (!) 45   Ht 5\' 6"  (1.676 m)   Wt 253 lb (114.8 kg)   SpO2 98%   BMI 40.84 kg/m , BMI Body mass index is 40.84 kg/m.  Wt Readings from Last 3 Encounters:  10/21/22 253 lb (114.8 kg)  06/19/22 248 lb (112.5 kg)  01/21/22 233 lb 3.2 oz (105.8 kg)    General: Patient appears comfortable at rest. HEENT: Conjunctiva and lids normal, oropharynx clear with moist mucosa. Neck: Supple, no elevated JVP or carotid bruits, no thyromegaly. Lungs: Clear to auscultation, nonlabored breathing at rest. Cardiac: Regular rate and rhythm, no S3 or significant systolic murmur, no pericardial rub. Abdomen: Soft, nontender, no hepatomegaly, bowel sounds present, no guarding or rebound. Extremities: No pitting edema, distal pulses 2+. Skin: Warm and dry. Musculoskeletal: No kyphosis. Neuropsychiatric: Alert and oriented x3, affect grossly appropriate.  ECG: Normal sinus rhythm and no ST-T changes  Recent Labwork: 11/14/2021: BUN 23; Creatinine, Ser 0.90; Potassium 4.2; Sodium 138     Component Value Date/Time   TRIG 78 04/09/2020 0427    Other Studies Reviewed Today: Echo from 09/2021 LVEF 30 to 35% Mild LVH Grade 1 diastolic function RV systolic function mild to moderately reduced LA moderately dilated RA mildly dilated Trivial  MR Mild dilatation of the aortic root 39 mm and mild dilation of the ascending aorta, 40 mm  CT cardiac in 08/2021 Notice of CAD, CAD RADS 0 Coronary calcium score of 0 Borderline dilated mid ascending aorta 38 mm  Assessment and Plan: Patient is a 62 year old F known to have NICM LVEF 30 to 35% in 3/23 s/p Saint Jude dual-chamber ICD, paroxysmal A-fib (newly diagnosed in 02/2021), history of PE (diagnosed in 04/2020 in setting of COVID-19 infection), HTN presented to the cardiology clinic for follow-up visit.  # NICM LVEF 30 to 35% s/p Saint Jude dual-chamber ICD, likely secondary to poorly controlled HTN # Chronic heart failure -Continue Lasix 20 mg once daily -Switch metoprolol to carvedilol 12.5 mg twice daily -Continue Entresto 97-103 mg twice daily -Continue spironolactone 25 mg once daily -Continue BiDil 20-37.5 mg 3 times daily -Continue Farxiga 10 mg once daily -Obtain limited echocardiogram for LVEF assessment now that the blood pressures are better controlled  compared to the previous values. -Ambulatory referral to cardiac rehab  # Resistant HTN -Blood pressures at home ranged from 140 to 150 mmHg SBP (160 to 180 mmHg SBP previously).  Instructed patient to check her blood pressures 2 times, a.m. and p.m. -Continue Lasix 20 mg once daily, switch metoprolol to carvedilol 12.5 mg twice daily, continue Entresto 97-103 mg twice daily, spironolactone 25 mg once daily, BiDil 20-37.5 mg 3 times daily and Farxiga 10 mg once daily. -OSA screening with WatchPAT -Obtain ultrasound renal duplex  # s/p Saint Jude dual-chamber ICD -Functioning well. Follow-up with EP for remote device interrogations.  # Paroxysmal A-fib -Switch metoprolol to carvedilol 12.5 mg twice daily -Continue Eliquis 5 mg twice daily  # Borderline dilatation of mid ascending aorta, 38 mm per CT cardiac in 2023 -Imaging surveillance of aortic dilatation every 3 years.  Next CT imaging in 2026.  # OSA  screening -Patient has OSA symptoms like snoring, choking at nighttime, morning headaches, morning labs etc.  She will benefit from OSA evaluation with WatchPAT testing.  WatchPAT test is approved but patient did not wear it yet. Instructed and reinforced the importance of OSA screening. If WatchPAT testing is negative for OSA, I will refer her to pulmonology for lab sleep study.  I have spent a total of 36 minutes with patient reviewing chart, EKGs, labs and examining patient as well as establishing an assessment and plan that was discussed with the patient.  > 50% of time was spent in direct patient care.     Medication Adjustments/Labs and Tests Ordered: Current medicines are reviewed at length with the patient today.  Concerns regarding medicines are outlined above.   Tests Ordered: Orders Placed This Encounter  Procedures   US RENAL ARTERY DUPLEX COMPLETE   AMB referral to cardiac rehabilitation   ECHOCARDIOGRAM LIMITED    Medication Changes: Meds ordered this encounter  Medications   carvedilol (COREG) 12.5 MG tablet    Sig: Take 1 tablet (12.5 mg total) by mouth 2 (two) times daily.    Dispense:  180 tablet    Refill:  3    Disposition:  6 months  Signed, Siona Coulston Verne SpurrPriya Urias Sheek, MD, 10/21/2022 2:51 PM    [facility] [address], [place], [place] [id number]

## 2022-10-21 NOTE — Patient Instructions (Addendum)
Medication Instructions:  Your physician has recommended you make the following change in your medication:   -Stop Metoprolol  -Start Coreg (Carvedilol) 12.5 mg twice daily.   *If you need a refill on your cardiac medications before your next appointment, please call your pharmacy*   Lab Work: None If you have labs (blood work) drawn today and your tests are completely normal, you will receive your results only by: MyChart Message (if you have MyChart) OR A paper copy in the mail If you have any lab test that is abnormal or we need to change your treatment, we will call you to review the results.   Testing/Procedures: Your physician has requested that you have an echocardiogram. Echocardiography is a painless test that uses sound waves to create images of your heart. It provides your doctor with information about the size and shape of your heart and how well your heart's chambers and valves are working. This procedure takes approximately one hour. There are no restrictions for this procedure. Please do NOT wear cologne, perfume, aftershave, or lotions (deodorant is allowed). Please arrive 15 minutes prior to your appointment time.  Your physician has requested that you have a renal artery doppler. During this test, an ultrasound is used to evaluate blood flow to the kidneys. Allow one hour for this exam. Do not eat after midnight the day before and avoid carbonated beverages. Take your medications as you usually do.    Follow-Up: At [facility], you and your health needs are our priority.  As part of our continuing mission to provide you with exceptional heart care, we have created designated Provider Care Teams.  These Care Teams include your primary Cardiologist (physician) and Advanced Practice Providers (APPs -  Physician Assistants and Nurse Practitioners) who all work together to provide you with the care you need, when you need it.  We recommend signing up for the  patient portal called "MyChart".  Sign up information is provided on this After Visit Summary.  MyChart is used to connect with patients for Virtual Visits (Telemedicine).  Patients are able to view lab/test results, encounter notes, upcoming appointments, etc.  Non-urgent messages can be sent to your provider as well.   To learn more about what you can do with MyChart, go to [email or web address].    Your next appointment:   6 month(s)  Provider:   Luane School, MD    Other Instructions

## 2022-10-27 ENCOUNTER — Other Ambulatory Visit (HOSPITAL_COMMUNITY): Payer: Self-pay | Admitting: Family Medicine

## 2022-10-27 DIAGNOSIS — Z1231 Encounter for screening mammogram for malignant neoplasm of breast: Secondary | ICD-10-CM

## 2022-11-02 ENCOUNTER — Ambulatory Visit (INDEPENDENT_AMBULATORY_CARE_PROVIDER_SITE_OTHER): Payer: 59

## 2022-11-02 DIAGNOSIS — I5020 Unspecified systolic (congestive) heart failure: Secondary | ICD-10-CM

## 2022-11-02 DIAGNOSIS — I429 Cardiomyopathy, unspecified: Secondary | ICD-10-CM

## 2022-11-03 LAB — CUP PACEART REMOTE DEVICE CHECK
Battery Remaining Longevity: 83 mo
Battery Remaining Percentage: 90 %
Battery Voltage: 3.1 V
Brady Statistic AP VP Percent: 1 %
Brady Statistic AP VS Percent: 7.9 %
Brady Statistic AS VP Percent: 1 %
Brady Statistic AS VS Percent: 87 %
Brady Statistic RA Percent Paced: 2.3 %
Brady Statistic RV Percent Paced: 1 %
Date Time Interrogation Session: 20240422020016
HighPow Impedance: 79 Ohm
HighPow Impedance: 79 Ohm
Implantable Lead Connection Status: 753985
Implantable Lead Connection Status: 753985
Implantable Lead Implant Date: 20230419
Implantable Lead Implant Date: 20230419
Implantable Lead Location: 753862
Implantable Lead Location: 753862
Implantable Lead Model: 7122
Implantable Pulse Generator Implant Date: 20230419
Lead Channel Impedance Value: 390 Ohm
Lead Channel Impedance Value: 450 Ohm
Lead Channel Pacing Threshold Amplitude: 0.5 V
Lead Channel Pacing Threshold Amplitude: 0.75 V
Lead Channel Pacing Threshold Pulse Width: 0.5 ms
Lead Channel Pacing Threshold Pulse Width: 0.5 ms
Lead Channel Sensing Intrinsic Amplitude: 2.6 mV
Lead Channel Sensing Intrinsic Amplitude: 7.7 mV
Lead Channel Setting Pacing Amplitude: 2 V
Lead Channel Setting Pacing Amplitude: 2.5 V
Lead Channel Setting Pacing Pulse Width: 0.5 ms
Lead Channel Setting Sensing Sensitivity: 0.5 mV
Pulse Gen Serial Number: 8939155

## 2022-11-25 ENCOUNTER — Other Ambulatory Visit: Payer: Self-pay

## 2022-11-25 DIAGNOSIS — I10 Essential (primary) hypertension: Secondary | ICD-10-CM

## 2022-12-08 NOTE — Progress Notes (Signed)
Remote ICD transmission.   

## 2022-12-21 ENCOUNTER — Ambulatory Visit (HOSPITAL_COMMUNITY): Admission: RE | Admit: 2022-12-21 | Payer: No Typology Code available for payment source | Source: Ambulatory Visit

## 2022-12-23 ENCOUNTER — Ambulatory Visit: Payer: Medicaid Other | Admitting: Internal Medicine

## 2022-12-25 ENCOUNTER — Telehealth: Payer: Self-pay | Admitting: Internal Medicine

## 2022-12-25 ENCOUNTER — Ambulatory Visit: Payer: No Typology Code available for payment source | Admitting: Internal Medicine

## 2022-12-25 NOTE — Telephone Encounter (Signed)
Patient had office visit today at 1:40 pm and canceled, re-scheduled 02/24/23   Messaged front office staff, she needs to r/s echo and renal US as they were marked No-show as well.   I spoke with her and she understands that she can file for disability after she has her renal US and echocardiogram in July. I reconfirmed [facility] for echo

## 2022-12-25 NOTE — Telephone Encounter (Signed)
Patient calling in to say that her disability paperwork was denied,because they didn't received enough information from our office. Please advise

## 2023-01-26 ENCOUNTER — Other Ambulatory Visit: Payer: Self-pay | Admitting: Internal Medicine

## 2023-02-01 ENCOUNTER — Ambulatory Visit (INDEPENDENT_AMBULATORY_CARE_PROVIDER_SITE_OTHER): Payer: Medicaid Other

## 2023-02-01 DIAGNOSIS — I429 Cardiomyopathy, unspecified: Secondary | ICD-10-CM

## 2023-02-02 ENCOUNTER — Encounter (HOSPITAL_BASED_OUTPATIENT_CLINIC_OR_DEPARTMENT_OTHER): Payer: Medicaid Other

## 2023-02-02 LAB — CUP PACEART REMOTE DEVICE CHECK
Battery Remaining Longevity: 81 mo
Battery Remaining Percentage: 87 %
Battery Voltage: 3.07 V
Brady Statistic AP VP Percent: 1 %
Brady Statistic AP VS Percent: 7.4 %
Brady Statistic AS VP Percent: 1 %
Brady Statistic AS VS Percent: 88 %
Brady Statistic RA Percent Paced: 2.1 %
Brady Statistic RV Percent Paced: 1 %
Date Time Interrogation Session: 20240722020029
HighPow Impedance: 86 Ohm
HighPow Impedance: 86 Ohm
Implantable Lead Connection Status: 753985
Implantable Lead Connection Status: 753985
Implantable Lead Implant Date: 20230419
Implantable Lead Implant Date: 20230419
Implantable Lead Location: 753862
Implantable Lead Location: 753862
Implantable Lead Model: 7122
Implantable Pulse Generator Implant Date: 20230419
Lead Channel Impedance Value: 380 Ohm
Lead Channel Impedance Value: 400 Ohm
Lead Channel Pacing Threshold Amplitude: 0.5 V
Lead Channel Pacing Threshold Amplitude: 0.75 V
Lead Channel Pacing Threshold Pulse Width: 0.5 ms
Lead Channel Pacing Threshold Pulse Width: 0.5 ms
Lead Channel Sensing Intrinsic Amplitude: 3.8 mV
Lead Channel Sensing Intrinsic Amplitude: 8.3 mV
Lead Channel Setting Pacing Amplitude: 2 V
Lead Channel Setting Pacing Amplitude: 2.5 V
Lead Channel Setting Pacing Pulse Width: 0.5 ms
Lead Channel Setting Sensing Sensitivity: 0.5 mV
Pulse Gen Serial Number: 8939155

## 2023-02-04 ENCOUNTER — Ambulatory Visit (HOSPITAL_COMMUNITY): Admission: RE | Admit: 2023-02-04 | Payer: No Typology Code available for payment source | Source: Ambulatory Visit

## 2023-02-11 NOTE — Progress Notes (Signed)
Remote ICD transmission.   

## 2023-02-24 ENCOUNTER — Ambulatory Visit: Payer: Medicaid Other | Attending: Internal Medicine | Admitting: Internal Medicine

## 2023-02-24 ENCOUNTER — Encounter: Payer: Self-pay | Admitting: Internal Medicine

## 2023-02-24 VITALS — BP 140/88 | HR 72 | Ht 66.0 in | Wt 256.4 lb

## 2023-02-24 DIAGNOSIS — I429 Cardiomyopathy, unspecified: Secondary | ICD-10-CM

## 2023-02-24 DIAGNOSIS — Z7901 Long term (current) use of anticoagulants: Secondary | ICD-10-CM | POA: Diagnosis not present

## 2023-02-24 MED ORDER — TORSEMIDE 20 MG PO TABS: 20.0000 mg | ORAL_TABLET | Freq: Two times a day (BID) | ORAL | 3 refills | Status: DC

## 2023-02-24 MED ORDER — CARVEDILOL 25 MG PO TABS: 25.0000 mg | ORAL_TABLET | Freq: Two times a day (BID) | ORAL | 3 refills | Status: AC

## 2023-02-24 MED ORDER — DAPAGLIFLOZIN PROPANEDIOL 10 MG PO TABS: 10.0000 mg | ORAL_TABLET | Freq: Every day | ORAL | 11 refills | Status: AC

## 2023-02-24 NOTE — Progress Notes (Signed)
Cardiology Office Note  Date: 02/24/2023   ID: Jo Knapp, [place] [birth year], MRN [id number]  PCP:  Oneal Grout, FNP  Cardiologist:  Marjo Bicker, MD Electrophysiologist:  None    History of Present Illness: Jo Knapp is a 62 y.o. female known to have NICM LVEF 30 to 35% in 3/23 s/p Saint Jude dual-chamber ICD, paroxysmal A-fib (newly diagnosed in 02/2021), history of PE (diagnosed in 04/2020 in setting of COVID-19 infection), HTN presented to the cardiology clinic for follow-up visit.  Home BPs ranged mostly around 130/80 mmHg (sometimes around 140 to 150 mmHg) but significantly improved compared to prior home BPs (160 to 180 mmHg SBP).  She has baseline SOB with exertion, no orthopnea and some PND.  She has a home sleep study kit with her and will undergo the sleep study when she feels like it.  Otherwise, no angina, dizziness, syncope or leg swelling.  Past Medical History:  Diagnosis Date   Aortic atherosclerosis (HCC)    Ascending aorta dilatation (HCC)    38mm by Chest CTA 2/23   HFrEF (heart failure with reduced ejection fraction) (HCC)    a. EF 25-30% by echo in 02/2021 and 30-35% on echo 09/2021 s/p AICD.  No CAD on coronary CTA   Hypertension    Paroxysmal A-fib (HCC)    a. diagnosed in 02/2021    Past Surgical History:  Procedure Laterality Date   ANKLE SURGERY     CESAREAN SECTION WITH BILATERAL TUBAL LIGATION     ICD IMPLANT N/A 10/29/2021   Procedure: ICD IMPLANT;  Surgeon: Marinus Maw, MD;  Location: [facility] INVASIVE CV LAB;  Service: Cardiovascular;  Laterality: N/A;    Current Outpatient Medications  Medication Sig Dispense Refill   acetaminophen (TYLENOL) 325 MG tablet Take 2 tablets (650 mg total) by mouth every 6 (six) hours as needed for mild pain (or Fever >/= 101). 12 tablet 0   albuterol (VENTOLIN HFA) 108 (90 Base) MCG/ACT inhaler Inhale 2 puffs into the lungs every 4 (four) hours as needed.     apixaban (ELIQUIS) 5 MG TABS tablet  Take 1 tablet (5 mg total) by mouth 2 (two) times daily. Start around 05/12/20 after completing initial started pack 60 tablet 11   atorvastatin (LIPITOR) 20 MG tablet Take 1 tablet (20 mg total) by mouth daily. 90 tablet 3   carvedilol (COREG) 12.5 MG tablet Take 1 tablet (12.5 mg total) by mouth 2 (two) times daily. 180 tablet 3   ENTRESTO 97-103 MG TAKE 1 TABLET BY MOUTH TWICE DAILY 180 tablet 3   isosorbide-hydrALAZINE (BIDIL) 20-37.5 MG tablet Take 1 tablet by mouth 3 (three) times daily. 270 tablet 3   omeprazole (PRILOSEC) 20 MG capsule Take 1 capsule (20 mg total) by mouth daily. 90 capsule 3   spironolactone (ALDACTONE) 25 MG tablet Take 0.5 tablets (12.5 mg total) by mouth daily. 45 tablet 3   dapagliflozin propanediol (FARXIGA) 10 MG TABS tablet Take 1 tablet (10 mg total) by mouth daily before breakfast. For Heart (Patient not taking: Reported on 02/24/2023) 30 tablet 11   fluticasone (FLONASE) 50 MCG/ACT nasal spray  (Patient not taking: Reported on 02/24/2023)     furosemide (LASIX) 20 MG tablet Take 1 tablet (20 mg total) by mouth daily. (Patient not taking: Reported on 02/24/2023) 90 tablet 3   Multiple Vitamins-Minerals (MULTIVITAMIN WITH MINERALS) tablet Take 1 tablet by mouth daily. Vita fusion (Patient not taking: Reported on 10/21/2022)     potassium chloride (  KLOR-CON) 10 MEQ tablet Take 1 tablet (10 mEq total) by mouth daily. Take While taking Lasix/furosemide (Patient not taking: Reported on 10/21/2022) 30 tablet 11   promethazine-dextromethorphan (PROMETHAZINE-DM) 6.25-15 MG/5ML syrup TAKE BY MOUTH EVERY 4 HOURS FOR 10 DAYS (Patient not taking: Reported on 02/24/2023)     senna (SENOKOT) 8.6 MG TABS tablet Take 1 tablet (8.6 mg total) by mouth daily as needed for mild constipation. (Patient not taking: Reported on 01/21/2022) 120 tablet 0   zinc sulfate 220 (50 Zn) MG capsule Take 1 capsule (220 mg total) by mouth daily. (Patient not taking: Reported on 10/21/2022) 30 capsule 3    No current facility-administered medications for this visit.   Allergies:  Patient has no known allergies.   Social History: The patient  reports that she has never smoked. She has never used smokeless tobacco. She reports that she does not currently use alcohol. She reports that she does not currently use drugs.   Family History: The patient's family history includes Heart disease in her mother; Kidney disease in her father. She was adopted.   ROS:  Please see the history of present illness. Otherwise, complete review of systems is positive for none.  All other systems are reviewed and negative.   Physical Exam: VS:  BP (!) 140/88   Pulse 72   Ht 5\' 6"  (1.676 m)   Wt 256 lb 6.4 oz (116.3 kg)   SpO2 95%   BMI 41.38 kg/m , BMI Body mass index is 41.38 kg/m.  Wt Readings from Last 3 Encounters:  02/24/23 256 lb 6.4 oz (116.3 kg)  10/21/22 253 lb (114.8 kg)  06/19/22 248 lb (112.5 kg)    General: Patient appears comfortable at rest. HEENT: Conjunctiva and lids normal, oropharynx clear with moist mucosa. Neck: Supple, no elevated JVP or carotid bruits, no thyromegaly. Lungs: Clear to auscultation, nonlabored breathing at rest. Cardiac: Regular rate and rhythm, no S3 or significant systolic murmur, no pericardial rub. Abdomen: Soft, nontender, no hepatomegaly, bowel sounds present, no guarding or rebound. Extremities: No pitting edema, distal pulses 2+. Skin: Warm and dry. Musculoskeletal: No kyphosis. Neuropsychiatric: Alert and oriented x3, affect grossly appropriate.  ECG: Normal sinus rhythm and no ST-T changes  Recent Labwork: No results found for requested labs within last 365 days.     Component Value Date/Time   TRIG 78 04/09/2020 0427    Other Studies Reviewed Today: Echo from 09/2021 LVEF 30 to 35% Mild LVH Grade 1 diastolic function RV systolic function mild to moderately reduced LA moderately dilated RA mildly dilated Trivial MR Mild dilatation of the  aortic root 39 mm and mild dilation of the ascending aorta, 40 mm  CT cardiac in 08/2021 Notice of CAD, CAD RADS 0 Coronary calcium score of 0 Borderline dilated mid ascending aorta 38 mm  Assessment and Plan: Patient is a 62 year old F known to have NICM LVEF 30 to 35% in 3/23 s/p Saint Jude dual-chamber ICD, paroxysmal A-fib (newly diagnosed in 02/2021), history of PE (diagnosed in 04/2020 in setting of COVID-19 infection), HTN presented to the cardiology clinic for follow-up visit.  # NICM LVEF 30 to 35% s/p Saint Jude dual-chamber ICD, likely secondary to poorly controlled HTN -DOE worsening with some orthopnea and PND, obtain BNP.  DOE likely secondary to morbid obesity with BMI 41.38 rather than heart failure exacerbation. Switch p.o. Lasix to p.o. torsemide 20 mg once daily, obtain BMP in 5 days. Increase carvedilol from 12.5 mg to 25 mg twice  daily, continue Entresto 97-103 mg twice daily, spironolactone 25 mg once daily, BiDil 20-27.5 mg three times daily, Farxiga 10 mg will be refilled today.  Obtain limited echocardiogram (scheduled in 03/2023) for LVEF assessment. -Referral to cardiac rehab previously placed.  # Resistant HTN, partially controlled now -Continue above medications -Home sleep study kit with her and USG renal Doppler ordered, not scheduled yet.  # s/p Saint Jude dual-chamber ICD -Follow-up up with EP for interrogations.  # Paroxysmal A-fib -Increase carvedilol dose from 12.5 mg to 25 mg twice daily and continue Eliquis 5 mg twice daily.  # Borderline dilatation of mid ascending aorta, 38 mm per CT cardiac in 2023 -Imaging surveillance of aortic dilatation every 3 years, next CT will be in 2026.  # OSA screening -Patient has OSA symptoms like snoring, choking at nighttime, morning headaches, morning labs etc.  She will benefit from OSA evaluation with WatchPAT testing.  WatchPAT test is approved but patient did not wear it yet. Instructed and reinforced the  importance of OSA screening. If WatchPAT testing is negative for OSA, I will refer her to pulmonology for lab sleep study.  Strongly encouraged weight loss.   Medication Adjustments/Labs and Tests Ordered: Current medicines are reviewed at length with the patient today.  Concerns regarding medicines are outlined above.   Tests Ordered: Orders Placed This Encounter  Procedures   EKG 12-Lead    Medication Changes: No orders of the defined types were placed in this encounter.   Disposition:  6 months  Signed,  Verne Spurr, MD, 02/24/2023 10:33 AM    [facility] at [facility] [address], [place], [place] [address]

## 2023-02-24 NOTE — Patient Instructions (Signed)
Medication Instructions:  Your physician has recommended you make the following change in your medication:   -Increase Coreg to 25 mg tablets twice daily -Start Torsemide 20 mg tablet once daily.   *If you need a refill on your cardiac medications before your next appointment, please call your pharmacy*   Lab Work: In 5 Days: -BNP -BMP  If you have labs (blood work) drawn today and your tests are completely normal, you will receive your results only by: MyChart Message (if you have MyChart) OR A paper copy in the mail If you have any lab test that is abnormal or we need to change your treatment, we will call you to review the results.   Testing/Procedures: None   Follow-Up: At [facility], you and your health needs are our priority.  As part of our continuing mission to provide you with exceptional heart care, we have created designated Provider Care Teams.  These Care Teams include your primary Cardiologist (physician) and Advanced Practice Providers (APPs -  Physician Assistants and Nurse Practitioners) who all work together to provide you with the care you need, when you need it.  We recommend signing up for the patient portal called "MyChart".  Sign up information is provided on this After Visit Summary.  MyChart is used to connect with patients for Virtual Visits (Telemedicine).  Patients are able to view lab/test results, encounter notes, upcoming appointments, etc.  Non-urgent messages can be sent to your provider as well.   To learn more about what you can do with MyChart, go to [email or web address].    Your next appointment:   6 month(s)  Provider:   Luane School, MD    Other Instructions

## 2023-02-25 ENCOUNTER — Other Ambulatory Visit (HOSPITAL_COMMUNITY): Payer: Self-pay

## 2023-02-25 ENCOUNTER — Telehealth: Payer: Self-pay

## 2023-02-25 NOTE — Telephone Encounter (Signed)
Pharmacy Patient Advocate Encounter   Received notification from CoverMyMeds that prior authorization for FARXIGA 10MG   is required/requested.   Insurance verification completed.   The patient is insured through [facility] .   Per test claim: PA required; PA submitted to [facility] via CoverMyMeds Key/confirmation #/EOC [id number]   Status is pending

## 2023-03-02 ENCOUNTER — Other Ambulatory Visit (HOSPITAL_COMMUNITY): Payer: Self-pay

## 2023-03-02 NOTE — Telephone Encounter (Signed)
Pharmacy Patient Advocate Encounter  Received notification from [facility] that Prior Authorization for Marcelline Deist  has been APPROVED from 8.15.24 to 8.15.25. Ran test claim, Copay is $4.00 for a 3month supply. This test claim was processed through [facility]- copay amounts may vary at other pharmacies due to pharmacy/plan contracts, or as the patient moves through the different stages of their insurance plan.

## 2023-03-16 ENCOUNTER — Ambulatory Visit (HOSPITAL_COMMUNITY)
Admission: RE | Admit: 2023-03-16 | Discharge: 2023-03-16 | Disposition: A | Discharge status: Home / Self Care | Payer: Medicaid Other | Source: Ambulatory Visit | Attending: Internal Medicine | Admitting: Internal Medicine

## 2023-03-16 DIAGNOSIS — I429 Cardiomyopathy, unspecified: Secondary | ICD-10-CM | POA: Diagnosis present

## 2023-03-16 DIAGNOSIS — I428 Other cardiomyopathies: Secondary | ICD-10-CM

## 2023-03-16 DIAGNOSIS — I5020 Unspecified systolic (congestive) heart failure: Secondary | ICD-10-CM | POA: Insufficient documentation

## 2023-03-16 LAB — ECHOCARDIOGRAM COMPLETE
AR max vel: 3.29 cm2
AV Area VTI: 3.23 cm2
AV Area mean vel: 3.26 cm2
AV Mean grad: 3.5 mmHg
AV Peak grad: 5.8 mmHg
Ao pk vel: 1.21 m/s
Calc EF: 61.5 %
S' Lateral: 3.9 cm
Single Plane A2C EF: 66.1 %
Single Plane A4C EF: 56.1 %

## 2023-03-16 NOTE — Progress Notes (Signed)
  Echocardiogram 2D Echocardiogram has been performed.  Jo Knapp 03/16/2023, 11:07 AM

## 2023-04-05 ENCOUNTER — Other Ambulatory Visit: Payer: Self-pay | Admitting: Internal Medicine

## 2023-04-21 ENCOUNTER — Other Ambulatory Visit: Payer: Self-pay | Admitting: Internal Medicine

## 2023-04-29 ENCOUNTER — Other Ambulatory Visit: Payer: Self-pay | Admitting: Internal Medicine

## 2023-05-03 ENCOUNTER — Ambulatory Visit (INDEPENDENT_AMBULATORY_CARE_PROVIDER_SITE_OTHER): Payer: Medicaid Other

## 2023-05-03 DIAGNOSIS — I429 Cardiomyopathy, unspecified: Secondary | ICD-10-CM | POA: Diagnosis not present

## 2023-05-03 DIAGNOSIS — I5020 Unspecified systolic (congestive) heart failure: Secondary | ICD-10-CM

## 2023-05-03 LAB — CUP PACEART REMOTE DEVICE CHECK
Battery Remaining Longevity: 80 mo
Battery Remaining Percentage: 86 %
Brady Statistic RA Percent Paced: 2.2 %
Brady Statistic RV Percent Paced: 1 %
HighPow Impedance: 87 Ohm
HighPow Impedance: 87 Ohm
Implantable Lead Connection Status: 753985
Implantable Lead Implant Date: 20230419
Implantable Lead Implant Date: 20230419
Implantable Lead Location: 753862
Implantable Lead Location: 753862
Implantable Lead Model: 7122
Lead Channel Impedance Value: 440 Ohm
Lead Channel Pacing Threshold Amplitude: 0.5 V
Lead Channel Pacing Threshold Amplitude: 0.75 V
Lead Channel Pacing Threshold Pulse Width: 0.5 ms
Lead Channel Sensing Intrinsic Amplitude: 8.1 mV
Lead Channel Setting Pacing Amplitude: 2 V
Lead Channel Setting Pacing Pulse Width: 0.5 ms

## 2023-05-04 LAB — CUP PACEART REMOTE DEVICE CHECK
Battery Voltage: 3.04 V
Brady Statistic AP VP Percent: 1 %
Brady Statistic AP VS Percent: 7.6 %
Brady Statistic AS VP Percent: 1 %
Brady Statistic AS VS Percent: 88 %
Date Time Interrogation Session: 20241021020016
Implantable Lead Connection Status: 753985
Implantable Pulse Generator Implant Date: 20230419
Lead Channel Impedance Value: 390 Ohm
Lead Channel Pacing Threshold Pulse Width: 0.5 ms
Lead Channel Sensing Intrinsic Amplitude: 3.4 mV
Lead Channel Setting Pacing Amplitude: 2.5 V
Lead Channel Setting Sensing Sensitivity: 0.5 mV
Pulse Gen Serial Number: 8939155

## 2023-05-17 ENCOUNTER — Other Ambulatory Visit: Payer: Self-pay | Admitting: Internal Medicine

## 2023-05-19 NOTE — Progress Notes (Signed)
Remote ICD transmission.   

## 2023-06-04 ENCOUNTER — Other Ambulatory Visit: Payer: Self-pay | Admitting: Internal Medicine

## 2023-07-10 ENCOUNTER — Other Ambulatory Visit: Payer: Self-pay | Admitting: Internal Medicine

## 2023-07-12 NOTE — Addendum Note (Signed)
Addended by: Roseanne Reno on: 07/12/2023 08:13 AM   Modules accepted: Orders

## 2023-07-22 ENCOUNTER — Other Ambulatory Visit: Payer: Self-pay | Admitting: Internal Medicine

## 2023-07-23 ENCOUNTER — Other Ambulatory Visit: Payer: Self-pay | Admitting: Internal Medicine

## 2023-08-02 ENCOUNTER — Ambulatory Visit (INDEPENDENT_AMBULATORY_CARE_PROVIDER_SITE_OTHER): Payer: Medicaid Other

## 2023-08-02 DIAGNOSIS — I429 Cardiomyopathy, unspecified: Secondary | ICD-10-CM

## 2023-08-02 DIAGNOSIS — I5020 Unspecified systolic (congestive) heart failure: Secondary | ICD-10-CM

## 2023-08-02 LAB — CUP PACEART REMOTE DEVICE CHECK
Battery Remaining Longevity: 77 mo
Battery Remaining Percentage: 83 %
Battery Voltage: 3.02 V
Brady Statistic AP VP Percent: 1 %
Brady Statistic AP VS Percent: 7.7 %
Brady Statistic AS VP Percent: 1 %
Brady Statistic AS VS Percent: 88 %
Brady Statistic RA Percent Paced: 2.1 %
Brady Statistic RV Percent Paced: 1 %
Date Time Interrogation Session: 20250120020017
HighPow Impedance: 99 Ohm
HighPow Impedance: 99 Ohm
Lead Channel Impedance Value: 380 Ohm
Lead Channel Impedance Value: 410 Ohm
Lead Channel Pacing Threshold Amplitude: 0.5 V
Lead Channel Pacing Threshold Amplitude: 0.75 V
Lead Channel Pacing Threshold Pulse Width: 0.5 ms
Lead Channel Pacing Threshold Pulse Width: 0.5 ms
Lead Channel Sensing Intrinsic Amplitude: 4.3 mV
Lead Channel Sensing Intrinsic Amplitude: 7.6 mV
Lead Channel Setting Pacing Amplitude: 2 V
Lead Channel Setting Pacing Amplitude: 2.5 V
Lead Channel Setting Pacing Pulse Width: 0.5 ms
Lead Channel Setting Sensing Sensitivity: 0.5 mV
Pulse Gen Serial Number: 8939155

## 2023-08-03 ENCOUNTER — Encounter: Payer: Self-pay | Admitting: Internal Medicine

## 2023-08-14 ENCOUNTER — Other Ambulatory Visit: Payer: Self-pay | Admitting: Internal Medicine

## 2023-09-01 ENCOUNTER — Ambulatory Visit: Payer: No Typology Code available for payment source | Attending: Internal Medicine | Admitting: Internal Medicine

## 2023-09-01 NOTE — Progress Notes (Signed)
 Erroneous encounter - please disregard.

## 2023-09-08 ENCOUNTER — Ambulatory Visit: Payer: Medicaid Other | Attending: Internal Medicine | Admitting: Internal Medicine

## 2023-09-08 ENCOUNTER — Encounter: Payer: Self-pay | Admitting: Internal Medicine

## 2023-09-09 NOTE — Progress Notes (Signed)
 Remote ICD transmission.

## 2023-11-01 ENCOUNTER — Ambulatory Visit (INDEPENDENT_AMBULATORY_CARE_PROVIDER_SITE_OTHER): Payer: 59

## 2023-11-01 DIAGNOSIS — I429 Cardiomyopathy, unspecified: Secondary | ICD-10-CM

## 2023-11-01 DIAGNOSIS — I5020 Unspecified systolic (congestive) heart failure: Secondary | ICD-10-CM

## 2023-11-03 ENCOUNTER — Encounter: Payer: Self-pay | Admitting: Internal Medicine

## 2023-11-03 LAB — CUP PACEART REMOTE DEVICE CHECK
Battery Remaining Longevity: 76 mo
Battery Remaining Percentage: 81 %
Battery Voltage: 3.02 V
Brady Statistic AP VP Percent: 1 %
Brady Statistic AP VS Percent: 7.2 %
Brady Statistic AS VP Percent: 1 %
Brady Statistic AS VS Percent: 88 %
Brady Statistic RA Percent Paced: 1.9 %
Brady Statistic RV Percent Paced: 1 %
Date Time Interrogation Session: 20250423083820
HighPow Impedance: 93 Ohm
HighPow Impedance: 93 Ohm
Lead Channel Impedance Value: 360 Ohm
Lead Channel Impedance Value: 410 Ohm
Lead Channel Pacing Threshold Amplitude: 0.5 V
Lead Channel Pacing Threshold Amplitude: 0.75 V
Lead Channel Pacing Threshold Pulse Width: 0.5 ms
Lead Channel Pacing Threshold Pulse Width: 0.5 ms
Lead Channel Sensing Intrinsic Amplitude: 2.8 mV
Lead Channel Sensing Intrinsic Amplitude: 7.6 mV
Lead Channel Setting Pacing Amplitude: 2 V
Lead Channel Setting Pacing Amplitude: 2.5 V
Lead Channel Setting Pacing Pulse Width: 0.5 ms
Lead Channel Setting Sensing Sensitivity: 0.5 mV
Pulse Gen Serial Number: 8939155

## 2023-11-15 ENCOUNTER — Ambulatory Visit: Attending: Internal Medicine | Admitting: Internal Medicine

## 2023-11-15 ENCOUNTER — Other Ambulatory Visit (HOSPITAL_COMMUNITY): Payer: Self-pay | Admitting: Family Medicine

## 2023-11-15 DIAGNOSIS — R051 Acute cough: Secondary | ICD-10-CM

## 2023-12-20 NOTE — Addendum Note (Signed)
 Addended by: Edra Govern D on: 12/20/2023 12:02 PM   Modules accepted: Orders

## 2023-12-20 NOTE — Progress Notes (Signed)
 Remote ICD transmission.

## 2024-01-31 ENCOUNTER — Ambulatory Visit: Payer: 59

## 2024-01-31 DIAGNOSIS — I429 Cardiomyopathy, unspecified: Secondary | ICD-10-CM

## 2024-02-01 LAB — CUP PACEART REMOTE DEVICE CHECK
Battery Remaining Longevity: 74 mo
Battery Remaining Percentage: 79 %
Battery Voltage: 3.01 V
Brady Statistic AP VP Percent: 1 %
Brady Statistic AP VS Percent: 7.1 %
Brady Statistic AS VP Percent: 1 %
Brady Statistic AS VS Percent: 89 %
Brady Statistic RA Percent Paced: 1.7 %
Brady Statistic RV Percent Paced: 1 %
Date Time Interrogation Session: 20250721020026
HighPow Impedance: 100 Ohm
HighPow Impedance: 100 Ohm
Lead Channel Impedance Value: 390 Ohm
Lead Channel Impedance Value: 430 Ohm
Lead Channel Pacing Threshold Amplitude: 0.5 V
Lead Channel Pacing Threshold Amplitude: 0.75 V
Lead Channel Pacing Threshold Pulse Width: 0.5 ms
Lead Channel Pacing Threshold Pulse Width: 0.5 ms
Lead Channel Sensing Intrinsic Amplitude: 5 mV
Lead Channel Sensing Intrinsic Amplitude: 8 mV
Lead Channel Setting Pacing Amplitude: 2 V
Lead Channel Setting Pacing Amplitude: 2.5 V
Lead Channel Setting Pacing Pulse Width: 0.5 ms
Lead Channel Setting Sensing Sensitivity: 0.5 mV
Pulse Gen Serial Number: 8939155

## 2024-02-02 ENCOUNTER — Ambulatory Visit: Payer: Self-pay | Admitting: Internal Medicine

## 2024-03-06 ENCOUNTER — Ambulatory Visit: Attending: Internal Medicine | Admitting: Internal Medicine

## 2024-03-06 ENCOUNTER — Encounter: Payer: Self-pay | Admitting: Internal Medicine

## 2024-03-06 VITALS — BP 180/98 | HR 84 | Ht 66.0 in | Wt 248.0 lb

## 2024-03-06 DIAGNOSIS — I5022 Chronic systolic (congestive) heart failure: Secondary | ICD-10-CM

## 2024-03-06 LAB — CUP PACEART INCLINIC DEVICE CHECK
Battery Remaining Longevity: 80 mo
Brady Statistic RA Percent Paced: 1.6 %
Brady Statistic RV Percent Paced: 0.4 %
Date Time Interrogation Session: 20250825134003
HighPow Impedance: 95.625
Implantable Lead Connection Status: 753985
Implantable Lead Connection Status: 753985
Implantable Lead Implant Date: 20230419
Implantable Lead Implant Date: 20230419
Implantable Lead Location: 753859
Implantable Lead Location: 753860
Implantable Lead Model: 7122
Implantable Pulse Generator Implant Date: 20230419
Lead Channel Impedance Value: 387.5 Ohm
Lead Channel Impedance Value: 412.5 Ohm
Lead Channel Pacing Threshold Amplitude: 0.75 V
Lead Channel Pacing Threshold Amplitude: 0.75 V
Lead Channel Pacing Threshold Amplitude: 1 V
Lead Channel Pacing Threshold Amplitude: 1 V
Lead Channel Pacing Threshold Pulse Width: 0.5 ms
Lead Channel Pacing Threshold Pulse Width: 0.5 ms
Lead Channel Pacing Threshold Pulse Width: 0.5 ms
Lead Channel Pacing Threshold Pulse Width: 0.5 ms
Lead Channel Sensing Intrinsic Amplitude: 5 mV
Lead Channel Sensing Intrinsic Amplitude: 8.8 mV
Lead Channel Setting Pacing Amplitude: 2 V
Lead Channel Setting Pacing Amplitude: 2.5 V
Lead Channel Setting Pacing Pulse Width: 0.5 ms
Lead Channel Setting Sensing Sensitivity: 0.5 mV
Pulse Gen Serial Number: 8939155

## 2024-03-06 NOTE — Patient Instructions (Signed)

## 2024-03-06 NOTE — Progress Notes (Signed)
 HPI Jo Knapp returns today for ongoing evaluation of a non-ischemic CM, s/p ICD insertion. She is a pleasant 63 yo woman with a non-ischemic CM, who has been treated with GDMT. She also has PAF and notes that she is out of rhythm 4-5 times a month, each lasting less than an hour. She denies syncope. No edema. No chest pain. She has class 2 CHF symptoms. Her ef has been 30% by echo.  she did not take her meds this morning as she was in a hurry getting her grandkids to school.  No Known Allergies   Current Outpatient Medications  Medication Sig Dispense Refill   acetaminophen  (TYLENOL ) 325 MG tablet Take 2 tablets (650 mg total) by mouth every 6 (six) hours as needed for mild pain (or Fever >/= 101). 12 tablet 0   albuterol  (VENTOLIN  HFA) 108 (90 Base) MCG/ACT inhaler Inhale 2 puffs into the lungs every 4 (four) hours as needed.     apixaban  (ELIQUIS ) 5 MG TABS tablet Take 1 tablet (5 mg total) by mouth 2 (two) times daily. Start around 05/12/20 after completing initial started pack 60 tablet 11   atorvastatin  (LIPITOR) 20 MG tablet TAKE ONE TABLET BY MOUTH ONCE DAILY FOR CHOLESTEROL 90 tablet 3   BIDIL 20-37.5 MG tablet TAKE ONE TABLET BY MOUTH THREE TIMES DAILY 270 tablet 3   carvedilol  (COREG ) 25 MG tablet Take 1 tablet (25 mg total) by mouth 2 (two) times daily. 180 tablet 3   dapagliflozin  propanediol (FARXIGA ) 10 MG TABS tablet Take 1 tablet (10 mg total) by mouth daily before breakfast. For Heart 30 tablet 11   ENTRESTO  97-103 MG TAKE 1 TABLET BY MOUTH TWICE DAILY 180 tablet 3   fluticasone (FLONASE) 50 MCG/ACT nasal spray      omeprazole  (PRILOSEC) 20 MG capsule TAKE (1) CAPSULE BY MOUTH ONCE DAILY. 90 capsule 3   sertraline (ZOLOFT) 100 MG tablet Take 100 mg by mouth daily.     spironolactone  (ALDACTONE ) 25 MG tablet TAKE (1/2) TABLET BY MOUTH ONCE DAILY. 45 tablet 1   torsemide  (DEMADEX ) 20 MG tablet Take 1 tablet (20 mg total) by mouth 2 (two) times daily. 180 tablet 3    Multiple Vitamins-Minerals (MULTIVITAMIN WITH MINERALS) tablet Take 1 tablet by mouth daily. Vita fusion (Patient not taking: Reported on 03/06/2024)     potassium chloride  (KLOR-CON ) 10 MEQ tablet Take 1 tablet (10 mEq total) by mouth daily. Take While taking Lasix /furosemide  (Patient not taking: Reported on 03/06/2024) 30 tablet 11   promethazine-dextromethorphan (PROMETHAZINE-DM) 6.25-15 MG/5ML syrup TAKE BY MOUTH EVERY 4 HOURS FOR 10 DAYS (Patient not taking: Reported on 03/06/2024)     senna (SENOKOT) 8.6 MG TABS tablet Take 1 tablet (8.6 mg total) by mouth daily as needed for mild constipation. (Patient not taking: Reported on 03/06/2024) 120 tablet 0   zinc  sulfate 220 (50 Zn) MG capsule Take 1 capsule (220 mg total) by mouth daily. (Patient not taking: Reported on 03/06/2024) 30 capsule 3   No current facility-administered medications for this visit.     Past Medical History:  Diagnosis Date   Aortic atherosclerosis (HCC)    Ascending aorta dilatation (HCC)    38mm by Chest CTA 2/23   HFrEF (heart failure with reduced ejection fraction) (HCC)    a. EF 25-30% by echo in 02/2021 and 30-35% on echo 09/2021 s/p AICD.  No CAD on coronary CTA   Hypertension    Paroxysmal A-fib (HCC)  a. diagnosed in 02/2021    ROS:   All systems reviewed and negative except as noted in the HPI.   Past Surgical History:  Procedure Laterality Date   ANKLE SURGERY     CESAREAN SECTION WITH BILATERAL TUBAL LIGATION     ICD IMPLANT N/A 10/29/2021   Procedure: ICD IMPLANT;  Surgeon: Waddell Danelle ORN, MD;  Location: [facility] INVASIVE CV LAB;  Service: Cardiovascular;  Laterality: N/A;     Family History  Adopted: Yes  Problem Relation Age of Onset   Heart disease Mother    Kidney disease Father      Social History   Socioeconomic History   Marital status: Single    Spouse name: Not on file   Number of children: Not on file   Years of education: Not on file   Highest education level: Not on file   Occupational History   Not on file  Tobacco Use   Smoking status: Never   Smokeless tobacco: Never  Vaping Use   Vaping status: Never Used  Substance and Sexual Activity   Alcohol use: Not Currently   Drug use: Not Currently   Sexual activity: Not on file  Other Topics Concern   Not on file  Social History Narrative   Not on file   Social Drivers of Health   Financial Resource Strain: Not on file  Food Insecurity: Not on file  Transportation Needs: Not on file  Physical Activity: Not on file  Stress: Not on file  Social Connections: Not on file  Intimate Partner Violence: Not on file     BP (!) 180/98 (BP Location: Left Arm, Cuff Size: Large)   Pulse 84   Ht 5' 6 (1.676 m)   Wt 248 lb (112.5 kg)   SpO2 95%   BMI 40.03 kg/m   Physical Exam:  Well appearing NAD HEENT: Unremarkable Neck:  No JVD, no thyromegally Lymphatics:  No adenopathy Back:  No CVA tenderness Lungs:  Clear with no wheezes HEART:  Regular rate rhythm, no murmurs, no rubs, no clicks Abd:  soft, positive bowel sounds, no organomegally, no rebound, no guarding Ext:  2 plus pulses, no edema, no cyanosis, no clubbing Skin:  No rashes no nodules Neuro:  CN II through XII intact, motor grossly intact  DEVICE  Normal device function.  See PaceArt for details.   Assess/Plan:  Chronic systolic heart failure- her symptoms remain class 2. She will continue her current meds. PAF - she is maintaining NSR. Continue eliquis  Obesity - we discussed avoidance of sugar sweetened beverages.. ICD - her [employer] Jude DDD ICD is working normally. We will follow.   Danelle Teal Raben,MD

## 2024-03-10 ENCOUNTER — Encounter: Admitting: Internal Medicine

## 2024-04-17 NOTE — Progress Notes (Signed)
 Remote ICD Transmission

## 2024-05-01 ENCOUNTER — Ambulatory Visit (INDEPENDENT_AMBULATORY_CARE_PROVIDER_SITE_OTHER): Payer: 59

## 2024-05-01 DIAGNOSIS — I429 Cardiomyopathy, unspecified: Secondary | ICD-10-CM

## 2024-05-02 LAB — CUP PACEART REMOTE DEVICE CHECK
Battery Remaining Longevity: 71 mo
Battery Remaining Percentage: 77 %
Battery Voltage: 3.01 V
Brady Statistic AP VP Percent: 1 %
Brady Statistic AP VS Percent: 7 %
Brady Statistic AS VP Percent: 1 %
Brady Statistic AS VS Percent: 89 %
Brady Statistic RA Percent Paced: 1.6 %
Brady Statistic RV Percent Paced: 1 %
Date Time Interrogation Session: 20251020134010
HighPow Impedance: 100 Ohm
HighPow Impedance: 100 Ohm
Lead Channel Impedance Value: 380 Ohm
Lead Channel Impedance Value: 410 Ohm
Lead Channel Pacing Threshold Amplitude: 0.75 V
Lead Channel Pacing Threshold Amplitude: 1 V
Lead Channel Pacing Threshold Pulse Width: 0.5 ms
Lead Channel Pacing Threshold Pulse Width: 0.5 ms
Lead Channel Sensing Intrinsic Amplitude: 3.9 mV
Lead Channel Sensing Intrinsic Amplitude: 7.8 mV
Lead Channel Setting Pacing Amplitude: 2 V
Lead Channel Setting Pacing Amplitude: 2.5 V
Lead Channel Setting Pacing Pulse Width: 0.5 ms
Lead Channel Setting Sensing Sensitivity: 0.5 mV
Pulse Gen Serial Number: 8939155

## 2024-05-03 ENCOUNTER — Ambulatory Visit: Payer: Self-pay | Admitting: Internal Medicine

## 2024-05-05 NOTE — Progress Notes (Signed)
 Remote ICD Transmission

## 2024-06-22 ENCOUNTER — Other Ambulatory Visit: Payer: Self-pay | Admitting: Internal Medicine

## 2024-07-17 ENCOUNTER — Emergency Department (HOSPITAL_COMMUNITY)

## 2024-07-17 ENCOUNTER — Encounter (HOSPITAL_COMMUNITY): Payer: Self-pay

## 2024-07-17 ENCOUNTER — Emergency Department (HOSPITAL_COMMUNITY)
Admission: EM | Admit: 2024-07-17 | Discharge: 2024-07-17 | Disposition: A | Discharge status: Home / Self Care | Attending: Emergency Medicine | Admitting: Emergency Medicine

## 2024-07-17 DIAGNOSIS — I6381 Other cerebral infarction due to occlusion or stenosis of small artery: Secondary | ICD-10-CM | POA: Diagnosis not present

## 2024-07-17 DIAGNOSIS — W108XXA Fall (on) (from) other stairs and steps, initial encounter: Secondary | ICD-10-CM | POA: Diagnosis not present

## 2024-07-17 DIAGNOSIS — I6789 Other cerebrovascular disease: Secondary | ICD-10-CM | POA: Insufficient documentation

## 2024-07-17 DIAGNOSIS — M546 Pain in thoracic spine: Secondary | ICD-10-CM | POA: Insufficient documentation

## 2024-07-17 DIAGNOSIS — Z7901 Long term (current) use of anticoagulants: Secondary | ICD-10-CM | POA: Insufficient documentation

## 2024-07-17 DIAGNOSIS — I4891 Unspecified atrial fibrillation: Secondary | ICD-10-CM | POA: Diagnosis not present

## 2024-07-17 DIAGNOSIS — W19XXXA Unspecified fall, initial encounter: Secondary | ICD-10-CM

## 2024-07-17 MED ORDER — KETOROLAC TROMETHAMINE 15 MG/ML IJ SOLN
15.0000 mg | Freq: Once | INTRAMUSCULAR | Status: AC
  Administered 2024-07-17: 15 mg via INTRAMUSCULAR
  Filled 2024-07-17: qty 1

## 2024-07-17 MED ORDER — LIDOCAINE 5 % EX PTCH: 1.0000 | MEDICATED_PATCH | CUTANEOUS | 0 refills | Status: AC

## 2024-07-17 MED ORDER — OXYCODONE-ACETAMINOPHEN 5-325 MG PO TABS
1.0000 | ORAL_TABLET | Freq: Once | ORAL | Status: AC
  Administered 2024-07-17: 1 via ORAL
  Filled 2024-07-17: qty 1

## 2024-07-17 MED ORDER — LIDOCAINE 5 % EX PTCH
1.0000 | MEDICATED_PATCH | CUTANEOUS | Status: DC
  Administered 2024-07-17: 1 via TRANSDERMAL
  Filled 2024-07-17: qty 1

## 2024-07-17 NOTE — Medical Student Note (Incomplete)
 "  [facility]-EMERGENCY DEPT Provider Student Note For educational purposes for Medical, PA and NP students only and not part of the legal medical record.   CSN: [id number] Arrival date & time: 07/17/24  1031      History   Chief Complaint Chief Complaint  Patient presents with   Back Pain    HPI Jo Knapp is a 64 y.o. female.  Pt is a 64 y.o. female who presents to the ED with CC of back pain. Pt has a past medical history of chronic back pain and PE for which she is on Eliquis .   Upon evaluation pt states that yesterday she feel down approximately 10 stairs. She states that she stayed on the floor for approximately 10 minutes before she was able to pull herself up using her TV stand. Pt denies hitting her head or loss of consciousness. Pt states she fell because she missed a step and did not feel abnormal before falling. Pt states that since the fall her lower back has felt tight and she has had pain with walking and movement. Pt reports pain throughout her entire spine but especially at the lower back.  Pt denies urinary incontinence, urinary symptoms, and vision changes. Pt took her at home Baclofen and hydrocodone and felt some relief however her pain was much worse upon waking this morning. During evaluation pt also states that she has had a cough, headache, SOB, and sore throat for the last 3-4 days. She states she has been taking Mucinex  and this has helped her cough. Pt states that her submandibular lymph nodes have been sore and swollen in the mornings since the onset of her cold symptoms. Pt states that they are still sore currently.    Back Pain   Past Medical History:  Diagnosis Date   Aortic atherosclerosis    Ascending aorta dilatation    38mm by Chest CTA 2/23   HFrEF (heart failure with reduced ejection fraction) (HCC)    a. EF 25-30% by echo in 02/2021 and 30-35% on echo 09/2021 s/p AICD.  No CAD on coronary CTA   Hypertension    Paroxysmal A-fib (HCC)    a.  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      Home Medications    Prior to Admission medications  Medication Sig Start Date End Date Taking? Authorizing Provider  acetaminophen  (TYLENOL ) 325 MG tablet Take 2 tablets (650 mg total) by  mouth every 6 (six) hours as needed for mild pain (or Fever >/= 101). 02/22/21   Pearlean Manus, MD  albuterol  (VENTOLIN  HFA) 108 (90 Base) MCG/ACT inhaler Inhale 2 puffs into the lungs every 4 (four) hours as needed.    [provider]  apixaban  (ELIQUIS ) 5 MG TABS tablet Take 1 tablet (5 mg total) by mouth 2 (two) times daily. Start around 05/12/20 after completing initial started pack 01/21/22   Waddell Danelle ORN, MD  atorvastatin  (LIPITOR) 20 MG tablet TAKE ONE TABLET BY MOUTH ONCE DAILY FOR CHOLESTEROL 06/04/23   Mallipeddi, Vishnu P, MD  BIDIL 20-37.5 MG tablet TAKE ONE TABLET BY  MOUTH THREE TIMES DAILY 08/16/23   Mallipeddi, Vishnu P, MD  carvedilol  (COREG ) 25 MG tablet Take 1 tablet (25 mg total) by mouth 2 (two) times daily. 02/24/23 03/06/24  Mallipeddi, Diannah SQUIBB, MD  dapagliflozin  propanediol (FARXIGA ) 10 MG TABS tablet Take 1 tablet (10 mg total) by mouth daily before breakfast. For Heart 02/24/23   Mallipeddi, Vishnu P, MD  fluticasone (FLONASE) 50 MCG/ACT nasal spray     [provider]  Multiple Vitamins-Minerals (MULTIVITAMIN WITH MINERALS) tablet Take 1 tablet by mouth daily. Vita fusion Patient not taking: Reported on 03/06/2024    [provider]  omeprazole  (PRILOSEC) 20 MG capsule TAKE (1) CAPSULE BY MOUTH ONCE DAILY. 06/04/23   Waddell Danelle ORN, MD  potassium chloride  (KLOR-CON ) 10 MEQ tablet Take 1 tablet (10 mEq total) by mouth daily. Take While taking Lasix /furosemide  Patient not taking: Reported on 03/06/2024 01/21/22   Waddell Danelle ORN, MD  promethazine-dextromethorphan (PROMETHAZINE-DM) 6.25-15 MG/5ML syrup TAKE BY MOUTH EVERY 4 HOURS FOR 10 DAYS Patient not taking: Reported on 03/06/2024    [provider]  sacubitril -valsartan  (ENTRESTO ) 97-103 MG TAKE ONE TABLET BY MOUTH TWICE DAILY 06/22/24   Mallipeddi, Vishnu P, MD  senna (SENOKOT) 8.6 MG TABS tablet Take 1 tablet (8.6 mg total) by mouth daily as needed for mild constipation. Patient not taking: Reported on 03/06/2024 02/22/21   Pearlean Manus, MD  sertraline (ZOLOFT) 100 MG tablet Take 100 mg by mouth daily.    [provider]  spironolactone  (ALDACTONE ) 25 MG tablet TAKE (1/2) TABLET BY MOUTH ONCE DAILY. 04/21/23   Mallipeddi, Vishnu P, MD  torsemide  (DEMADEX ) 20 MG tablet Take 1 tablet (20 mg total) by mouth 2 (two) times daily. 02/24/23 03/06/24  Mallipeddi, Vishnu P, MD  zinc  sulfate 220 (50 Zn) MG capsule Take 1 capsule (220 mg total) by mouth daily. Patient not taking: Reported on 03/06/2024 02/22/21   Pearlean Manus, MD    Family History Family History   Adopted: Yes  Problem Relation Age of Onset   Heart disease Mother    Kidney disease Father     Social History Social History[1]   Allergies   Patient has no known allergies.   Review of Systems Review of Systems  Musculoskeletal:  Positive for back pain.     Physical Exam Updated Vital Signs BP (!) 168/94 (BP Location: Right Arm)   Pulse 77   Temp 98 F (36.7 C)   Resp 18   Ht 5' 6 (1.676 m)   Wt 112.5 kg   SpO2 97%   BMI 40.03 kg/m   Physical Exam   ED Treatments / Results  Labs (all labs ordered are listed, but only abnormal results are displayed) Labs Reviewed - No data to display  EKG  Radiology No results found.  Procedures Procedures (including critical care time)  Medications Ordered in ED Medications  oxyCODONE -acetaminophen  (PERCOCET/ROXICET) 5-325 MG per tablet 1 tablet (has no administration in time range)  lidocaine  (LIDODERM ) 5 % 1 patch (has no administration in time range)     Initial Impression / Assessment and Plan / ED Course  I have reviewed the triage vital signs and the nursing notes.  Pertinent labs & imaging results that were available during my care of the patient were reviewed by me and considered in my medical decision making (see chart for details).     ***  Final Clinical Impressions(s) / ED Diagnoses   Final diagnoses:  None    New Prescriptions New Prescriptions   No medications on file       [1]  Social History Tobacco Use   Smoking status: Never   Smokeless tobacco: Never  Vaping Use   Vaping status: Never Used  Substance Use Topics   Alcohol use: Not Currently   Drug use: Not Currently   "
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
## 2024-07-17 NOTE — ED Triage Notes (Addendum)
 Pt c/o chronic low back pain worsening x2 days.  Pain score 10/10 w/ movement.  Denies GI/GU complaints.  Denies shooting pain in legs.  Hx of arthritis.     Per chart review, Pt is followed by [facility] for same and has been going to physical therapy.

## 2024-07-17 NOTE — ED Provider Notes (Signed)
 " [place] EMERGENCY DEPARTMENT AT [facility] Provider Note   CSN: [id number] Arrival date & time: 07/17/24  1031     Patient presents with: Back Pain   Jo Knapp is a 64 y.o. female.   Patient with a history of ascending aortic dilatation, HFrEF, hypertension, PE on Eliquis , afib presents today with complaints of fall.  Reports that same occurred yesterday when she was walking down the steps and missed a step and steps.  She did not hit her head or lose consciousness.  She was able to get up with the help of her TV stand and walk around afterwards and originally thought she was fine, however today she is feeling more sore.  She has been taking her prescribed hydrocodone and Robaxin with some improvement.  Reports that she does have a history of chronic low back pain and this pain feels similar in nature but slightly more severe than before.  Denies any sharp shooting pain down her extremities or numbness/tingling.  No loss of bowel or bladder function or saddle anesthesia.  Also notes that she has been having some flulike symptoms the last few days.  Reports that originally she had a significant cough but this has since improved.  Denies chest pain or shortness of breath.  The history is provided by the patient. No language interpreter was used.  Back Pain      Prior to Admission medications  Medication Sig Start Date End Date Taking? Authorizing Provider  acetaminophen  (TYLENOL ) 325 MG tablet Take 2 tablets (650 mg total) by mouth every 6 (six) hours as needed for mild pain (or Fever >/= 101). 02/22/21   Pearlean Manus, MD  albuterol  (VENTOLIN  HFA) 108 (90 Base) MCG/ACT inhaler Inhale 2 puffs into the lungs every 4 (four) hours as needed.    [provider]  apixaban  (ELIQUIS ) 5 MG TABS tablet Take 1 tablet (5 mg total) by mouth 2 (two) times daily. Start around 05/12/20 after completing initial started pack 01/21/22   Waddell Danelle ORN, MD  atorvastatin  (LIPITOR)  20 MG tablet TAKE ONE TABLET BY MOUTH ONCE DAILY FOR CHOLESTEROL 06/04/23   Mallipeddi, Vishnu P, MD  BIDIL 20-37.5 MG tablet TAKE ONE TABLET BY MOUTH THREE TIMES DAILY 08/16/23   Mallipeddi, Vishnu P, MD  carvedilol  (COREG ) 25 MG tablet Take 1 tablet (25 mg total) by mouth 2 (two) times daily. 02/24/23 03/06/24  Mallipeddi, Diannah SQUIBB, MD  dapagliflozin  propanediol (FARXIGA ) 10 MG TABS tablet Take 1 tablet (10 mg total) by mouth daily before breakfast. For Heart 02/24/23   Mallipeddi, Vishnu P, MD  fluticasone (FLONASE) 50 MCG/ACT nasal spray     [provider]  Multiple Vitamins-Minerals (MULTIVITAMIN WITH MINERALS) tablet Take 1 tablet by mouth daily. Vita fusion Patient not taking: Reported on 03/06/2024    [provider]  omeprazole  (PRILOSEC) 20 MG capsule TAKE (1) CAPSULE BY MOUTH ONCE DAILY. 06/04/23   Waddell Danelle ORN, MD  potassium chloride  (KLOR-CON ) 10 MEQ tablet Take 1 tablet (10 mEq total) by mouth daily. Take While taking Lasix /furosemide  Patient not taking: Reported on 03/06/2024 01/21/22   Waddell Danelle ORN, MD  promethazine-dextromethorphan (PROMETHAZINE-DM) 6.25-15 MG/5ML syrup TAKE BY MOUTH EVERY 4 HOURS FOR 10 DAYS Patient not taking: Reported on 03/06/2024    [provider]  sacubitril -valsartan  (ENTRESTO ) 97-103 MG TAKE ONE TABLET BY MOUTH TWICE DAILY 06/22/24   Mallipeddi, Vishnu P, MD  senna (SENOKOT) 8.6 MG TABS tablet Take 1 tablet (8.6 mg total) by  mouth daily as needed for mild constipation. Patient not taking: Reported on 03/06/2024 02/22/21   Pearlean Manus, MD  sertraline (ZOLOFT) 100 MG tablet Take 100 mg by mouth daily.    [provider]  spironolactone  (ALDACTONE ) 25 MG tablet TAKE (1/2) TABLET BY MOUTH ONCE DAILY. 04/21/23   Mallipeddi, Vishnu P, MD  torsemide  (DEMADEX ) 20 MG tablet Take 1 tablet (20 mg total) by mouth 2 (two) times daily. 02/24/23 03/06/24  Mallipeddi, Vishnu P, MD  zinc  sulfate 220 (50 Zn) MG capsule Take 1 capsule  (220 mg total) by mouth daily. Patient not taking: Reported on 03/06/2024 02/22/21   Pearlean Manus, MD    Allergies: Patient has no known allergies.    Review of Systems  Musculoskeletal:  Positive for back pain.  All other systems reviewed and are negative.   Updated Vital Signs BP (!) 168/94 (BP Location: Right Arm)   Pulse 77   Temp 98 F (36.7 C)   Resp 18   Ht 5' 6 (1.676 m)   Wt 112.5 kg   SpO2 97%   BMI 40.03 kg/m   Physical Exam Vitals and nursing note reviewed.  Constitutional:      General: She is not in acute distress.    Appearance: Normal appearance. She is normal weight. She is not ill-appearing, toxic-appearing or diaphoretic.  HENT:     Head: Normocephalic and atraumatic.     Comments: No racoon eyes No battle sign Eyes:     Extraocular Movements: Extraocular movements intact.     Pupils: Pupils are equal, round, and reactive to light.  Cardiovascular:     Rate and Rhythm: Normal rate and regular rhythm.     Heart sounds: Normal heart sounds.     Comments: No tenderness to palpation of the anterior chest wall Pulmonary:     Effort: Pulmonary effort is normal. No respiratory distress.     Breath sounds: Normal breath sounds.  Abdominal:     Comments: No abdominal tenderness or bruising  Musculoskeletal:        General: Normal range of motion.     Cervical back: Normal and normal range of motion.     Thoracic back: Normal.     Lumbar back: Normal.     Comments: Tenderness noted to palpation of the midline neck area and surrounding musculature.  5/5 strength and sensation intact in bilateral upper and lower extremities. No step-offs, lesions, deformity, or overlying skin changes. Radial pulse intact and 2+  Tenderness noted to palpation of the midline lumbar spine area and surrounding musculature. No step-offs, lesions, deformity, or overlying skin changes.  Ambulatory.  DP and PT pulses intact 2+.  No other areas of focal bony tenderness  Skin:     General: Skin is warm and dry.  Neurological:     General: No focal deficit present.     Mental Status: She is alert and oriented to person, place, and time.  Psychiatric:        Mood and Affect: Mood normal.        Behavior: Behavior normal.     (all labs ordered are listed, but only abnormal results are displayed) Labs Reviewed - No data to display  EKG: None  Radiology: CT Lumbar Spine Wo Contrast Result Date: 07/17/2024 CLINICAL DATA:  Fall back trauma EXAM: CT LUMBAR SPINE WITHOUT CONTRAST TECHNIQUE: Multidetector CT imaging of the lumbar spine was performed without intravenous contrast administration. Multiplanar CT image reconstructions were also generated. RADIATION DOSE REDUCTION:  This exam was performed according to the departmental dose-optimization program which includes automated exposure control, adjustment of the mA and/or kV according to patient size and/or use of iterative reconstruction technique. COMPARISON:  Chest x-ray 10/29/2021 FINDINGS: Segmentation: 11 rib pairs noted on prior chest radiography. 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 transitional anatomy, for the purposes of reporting, transitional segment will be sacralized L5. Alignment: Normal. Vertebrae: No acute fracture or focal pathologic process. Paraspinal and other soft tissues: No acute paravertebral or paraspinal soft tissue abnormality. Disc levels: At T12 L1, patent disc space. No canal stenosis or foraminal narrowing. Moderate facet degenerative changes. At L1-L2, patent disc space. No canal stenosis. Moderate facet degenerative changes. At L2-L3, mild disc space narrowing. Moderate severe hypertrophic facet degenerative changes and ligamentum flavum thickening. No high-grade canal stenosis. At L3-L4, patent disc space. Disc bulge. Moderate hypertrophic facet degenerative change and ligamentum flavum thickening. There may be mild canal stenosis. Mild left foraminal narrowing. At L4-L5, vacuum disc. Disc bulge.  Advanced hypertrophic facet degenerative changes. No high-grade canal stenosis. IMPRESSION: 1. No CT evidence for acute osseous abnormality. 2. Transitional anatomy, detailed above. 3. Mild multilevel degenerative changes with prominent multilevel hypertrophic facet degeneration Electronically Signed   By: Luke Bun M.D.   On: 07/17/2024 16:59   CT Cervical Spine Wo Contrast Result Date: 07/17/2024 CLINICAL DATA:  Status post fall. EXAM: CT CERVICAL SPINE WITHOUT CONTRAST TECHNIQUE: Multidetector CT imaging of the cervical spine was performed without intravenous contrast. Multiplanar CT image reconstructions were also generated. RADIATION DOSE REDUCTION: This exam was performed according to the departmental dose-optimization program which includes automated exposure control, adjustment of the mA and/or kV according to patient size and/or use of iterative reconstruction technique. COMPARISON:  None Available. FINDINGS: Alignment: There is reversal of the normal cervical spine lordosis. Skull base and vertebrae: No acute fracture. No primary bone lesion or focal pathologic process. Soft tissues and spinal canal: No prevertebral fluid or swelling. No visible canal hematoma. Disc levels: Moderate severity endplate sclerosis, anterior osteophyte formation and posterior bony spurring are seen at the level of C5-C6, and to a lesser degree C4-C5. Moderate to marked severity intervertebral disc space narrowing is present at C5-C6, with moderate severity intervertebral disc space narrowing at C4-C5. Bilateral moderate severity multilevel facet joint hypertrophy is noted. Upper chest: Negative. Other: Multiple subcentimeter anterior and posterior cervical chain lymph nodes are seen. IMPRESSION: 1. No acute fracture or subluxation in the cervical spine. 2. Moderate to marked severity degenerative changes at the levels of C4-C5 and C5-C6. Electronically Signed   By: Suzen Dials M.D.   On: 07/17/2024 16:24   CT Head  Wo Contrast Result Date: 07/17/2024 CLINICAL DATA:  Status post fall. EXAM: CT HEAD WITHOUT CONTRAST TECHNIQUE: Contiguous axial images were obtained from the base of the skull through the vertex without intravenous contrast. RADIATION DOSE REDUCTION: This exam was performed according to the departmental dose-optimization program which includes automated exposure control, adjustment of the mA and/or kV according to patient size and/or use of iterative reconstruction technique. COMPARISON:  May 07, 2014 FINDINGS: Brain: There is generalized cerebral atrophy with widening of the extra-axial spaces and ventricular dilatation. There are areas of decreased attenuation within the white matter tracts of the supratentorial brain, consistent with microvascular disease changes. Small, chronic bilateral para thalamic lacunar infarcts are noted. Vascular: No hyperdense vessel or unexpected calcification. Skull: Normal. Negative for fracture or focal lesion. Sinuses/Orbits: No acute finding. Other: None. IMPRESSION: 1. Generalized cerebral atrophy and microvascular disease  changes of the supratentorial brain. 2. Small, chronic bilateral para thalamic lacunar infarcts. 3. No acute intracranial abnormality. Electronically Signed   By: Suzen Dials M.D.   On: 07/17/2024 16:19     Procedures   Medications Ordered in the ED  lidocaine  (LIDODERM ) 5 % 1 patch (1 patch Transdermal Patch Applied 07/17/24 1455)  oxyCODONE -acetaminophen  (PERCOCET/ROXICET) 5-325 MG per tablet 1 tablet (1 tablet Oral Given 07/17/24 1448)                                    Medical Decision Making Amount and/or Complexity of Data Reviewed Radiology: ordered.  Risk Prescription drug management.   Patient presents today with complaints of mechanical fall on Eliquis  yesterday evening.  They are afebrile, nontoxic-appearing, and in no acute distress with reassuring vital signs.  Physical exam reveals Tenderness noted to palpation of the  midline neck area and surrounding musculature.  5/5 strength and sensation intact in bilateral upper and lower extremities. No step-offs, lesions, deformity, or overlying skin changes. Radial pulse intact and 2+  Tenderness noted to palpation of the midline lumbar spine area and surrounding musculature. No step-offs, lesions, deformity, or overlying skin changes.  Ambulatory.  DP and PT pulses intact 2+.  No other areas of focal bony tenderness. Patient without signs of serious head, neck, or back injury. No TTP of the chest or abd. Normal neurological exam. No concern for lung injury, or intraabdominal injury.  CT imaging obtained of the head, neck, and L-spine which has resulted and reveals   CT head: 1. Generalized cerebral atrophy and microvascular disease changes of the supratentorial brain. 2. Small, chronic bilateral para thalamic lacunar infarcts. 3. No acute intracranial abnormality.  CT cervical spine: 1. No acute fracture or subluxation in the cervical spine. 2. Moderate to marked severity degenerative changes at the levels of C4-C5 and C5-C6.  CT lumbar spine: 1. No CT evidence for acute osseous abnormality. 2. Transitional anatomy, detailed above. 3. Mild multilevel degenerative changes with prominent multilevel hypertrophic facet degeneration  I have personally reviewed and interpreted this imaging and agree with radiology interpretation.  Radiology without acute abnormality.  Patient is able to ambulate without difficulty in the ED.  Pt is hemodynamically stable, in NAD.   Pain has been managed & pt has no complaints prior to dc.  Patient counseled on typical course of muscle stiffness and soreness post-fall. Discussed s/s that should cause them to return. Patient instructed on NSAID use.  She already has hydrocodone and Robaxin at home.  Encouraged PCP follow-up for recheck if symptoms are not improved in one week.  She has already follows with orthopedics and is doing  physical therapy for her chronic back pain, recommend she call them to schedule follow-up appointment as well.  She has no signs or symptoms to suggest cauda equina.  Additionally, she did mention some flulike symptoms, however confirms that she is not here to be evaluated for this today.  Evaluation and diagnostic testing in the emergency department does not suggest an emergent condition requiring admission or immediate intervention beyond what has been performed at this time.  Plan for discharge with close PCP follow-up.  Patient is understanding and amenable with plan, educated on red flag symptoms that would prompt immediate return.  Patient discharged in stable condition.   Final diagnoses:  Fall, initial encounter    ED Discharge Orders  Ordered    lidocaine  (LIDODERM ) 5 %  Every 24 hours        07/17/24 1800          An After Visit Summary was printed and given to the patient.      Chalon Zobrist A, PA-C 07/17/24 ZEB Suzette Pac, MD 07/18/24 1711  "

## 2024-07-24 ENCOUNTER — Other Ambulatory Visit: Payer: Self-pay | Admitting: Internal Medicine

## 2024-07-31 ENCOUNTER — Ambulatory Visit: Payer: 59

## 2024-07-31 DIAGNOSIS — I429 Cardiomyopathy, unspecified: Secondary | ICD-10-CM | POA: Diagnosis not present

## 2024-08-04 LAB — CUP PACEART REMOTE DEVICE CHECK
Battery Remaining Longevity: 69 mo
Battery Remaining Percentage: 75 %
Battery Voltage: 3.01 V
Brady Statistic AP VP Percent: 1 %
Brady Statistic AP VS Percent: 7 %
Brady Statistic AS VP Percent: 1 %
Brady Statistic AS VS Percent: 89 %
Brady Statistic RA Percent Paced: 1.5 %
Brady Statistic RV Percent Paced: 1 %
Date Time Interrogation Session: 20260122091439
HighPow Impedance: 95 Ohm
HighPow Impedance: 95 Ohm
Lead Channel Impedance Value: 360 Ohm
Lead Channel Impedance Value: 400 Ohm
Lead Channel Pacing Threshold Amplitude: 0.75 V
Lead Channel Pacing Threshold Amplitude: 1 V
Lead Channel Pacing Threshold Pulse Width: 0.5 ms
Lead Channel Pacing Threshold Pulse Width: 0.5 ms
Lead Channel Sensing Intrinsic Amplitude: 12 mV
Lead Channel Sensing Intrinsic Amplitude: 3.2 mV
Lead Channel Setting Pacing Amplitude: 2 V
Lead Channel Setting Pacing Amplitude: 2.5 V
Lead Channel Setting Pacing Pulse Width: 0.5 ms
Lead Channel Setting Sensing Sensitivity: 0.5 mV
Pulse Gen Serial Number: 8939155

## 2024-08-04 NOTE — Progress Notes (Signed)
 Remote ICD Transmission

## 2024-08-08 ENCOUNTER — Ambulatory Visit: Payer: Self-pay | Admitting: Cardiovascular Disease

## 2024-10-30 ENCOUNTER — Ambulatory Visit

## 2025-01-29 ENCOUNTER — Ambulatory Visit

## 2025-04-30 ENCOUNTER — Ambulatory Visit

## 2025-07-30 ENCOUNTER — Ambulatory Visit

## 2025-10-29 ENCOUNTER — Ambulatory Visit
